# Patient Record
Sex: Male | Born: 1955 | Race: White | Hispanic: No | Marital: Single | Smoking: Current every day smoker
Health system: Southern US, Community
[De-identification: ages and names within clinical notes are randomized; demographics above are authoritative.]

## PROBLEM LIST (undated history)

## (undated) DIAGNOSIS — J310 Chronic rhinitis: Secondary | ICD-10-CM

## (undated) DIAGNOSIS — K219 Gastro-esophageal reflux disease without esophagitis: Secondary | ICD-10-CM

## (undated) DIAGNOSIS — R6882 Decreased libido: Secondary | ICD-10-CM

## (undated) DIAGNOSIS — F429 Obsessive-compulsive disorder, unspecified: Secondary | ICD-10-CM

## (undated) DIAGNOSIS — G4733 Obstructive sleep apnea (adult) (pediatric): Secondary | ICD-10-CM

## (undated) DIAGNOSIS — I10 Essential (primary) hypertension: Secondary | ICD-10-CM

## (undated) DIAGNOSIS — J302 Other seasonal allergic rhinitis: Secondary | ICD-10-CM

## (undated) DIAGNOSIS — I2510 Atherosclerotic heart disease of native coronary artery without angina pectoris: Secondary | ICD-10-CM

## (undated) DIAGNOSIS — Z85828 Personal history of other malignant neoplasm of skin: Secondary | ICD-10-CM

## (undated) HISTORY — DX: Personal history of other malignant neoplasm of skin: Z85.828

## (undated) HISTORY — DX: Other seasonal allergic rhinitis: J30.2

## (undated) HISTORY — DX: Gastro-esophageal reflux disease without esophagitis: K21.9

## (undated) HISTORY — DX: Essential (primary) hypertension: I10

## (undated) HISTORY — PX: COLONOSCOPY: SHX174

## (undated) HISTORY — PX: APPENDECTOMY: SHX54

## (undated) HISTORY — DX: Chronic rhinitis: J31.0

## (undated) HISTORY — DX: Decreased libido: R68.82

## (undated) HISTORY — PX: WISDOM TOOTH EXTRACTION: SHX21

## (undated) HISTORY — PX: TONSILLECTOMY: SUR1361

## (undated) HISTORY — DX: Obsessive-compulsive disorder, unspecified: F42.9

## (undated) HISTORY — PX: SKIN CANCER EXCISION: SHX779

## (undated) HISTORY — DX: Obstructive sleep apnea (adult) (pediatric): G47.33

---

## 1997-12-03 ENCOUNTER — Encounter: Payer: Self-pay | Admitting: Emergency Medicine

## 1997-12-03 ENCOUNTER — Observation Stay (HOSPITAL_COMMUNITY): Admission: EM | Admit: 1997-12-03 | Discharge: 1997-12-04 | Payer: Self-pay | Admitting: Emergency Medicine

## 1999-04-13 ENCOUNTER — Emergency Department (HOSPITAL_COMMUNITY): Admission: EM | Admit: 1999-04-13 | Discharge: 1999-04-13 | Payer: Self-pay | Admitting: Emergency Medicine

## 1999-07-20 ENCOUNTER — Ambulatory Visit (HOSPITAL_COMMUNITY): Admission: RE | Admit: 1999-07-20 | Discharge: 1999-07-20 | Payer: Self-pay | Admitting: Gastroenterology

## 2002-08-23 ENCOUNTER — Ambulatory Visit (HOSPITAL_BASED_OUTPATIENT_CLINIC_OR_DEPARTMENT_OTHER): Admission: RE | Admit: 2002-08-23 | Discharge: 2002-08-23 | Payer: Self-pay | Admitting: Family Medicine

## 2006-08-28 ENCOUNTER — Inpatient Hospital Stay (HOSPITAL_COMMUNITY): Admission: EM | Admit: 2006-08-28 | Discharge: 2006-08-31 | Payer: Self-pay | Admitting: Emergency Medicine

## 2006-09-29 ENCOUNTER — Encounter: Admission: RE | Admit: 2006-09-29 | Discharge: 2006-09-29 | Payer: Self-pay | Admitting: Neurosurgery

## 2008-06-03 IMAGING — CT CT CERVICAL SPINE W/O CM
2 of 13 series · 7 of 33 positions shown, 8 images · IV contrast (omnipaque)
Comparison: None.
COMPARISON: None.
COMPARISON: None.

CLINICAL DATA: 51 year-old-male fell 10 feet off a roof and landed on cement. 
 HEAD CT WITHOUT CONTRAST:
TECHNIQUE: Contiguous axial images were obtained from the base of the skull through the vertex according to standard protocol without contrast.
TECHNIQUE: Multidetector CT imaging of the cervical spine was performed.  Multiplanar CT image reconstructions were also generated.
TECHNIQUE: Multidetector CT imaging of the chest was performed following the standard protocol during bolus administration of intravenous contrast.
 Contrast:  100 cc Omnipaque 300.
TECHNIQUE: Multidetector CT imaging of the abdomen was performed following the standard protocol during bolus administration of intravenous contrast.
TECHNIQUE: Multidetector CT imaging of the pelvis was performed following the standard protocol during bolus administration of intravenous contrast.

[Series 12: abd pelvis · axial · 0.98mm/px · z∈[-70,+485]mm · 3 of 112 slices shown, 4 images]
[im 1/112  soft-tissue]
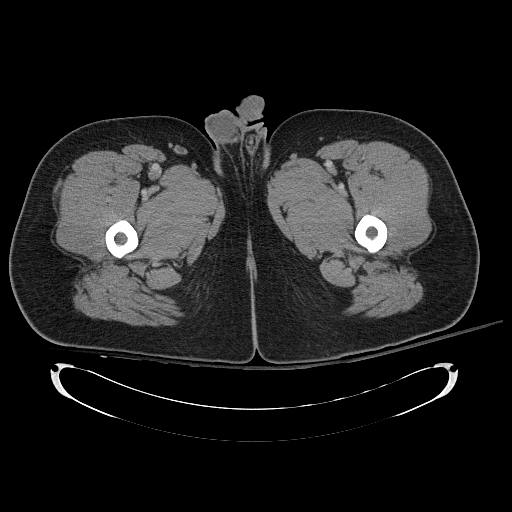
[im 1/112  bone]
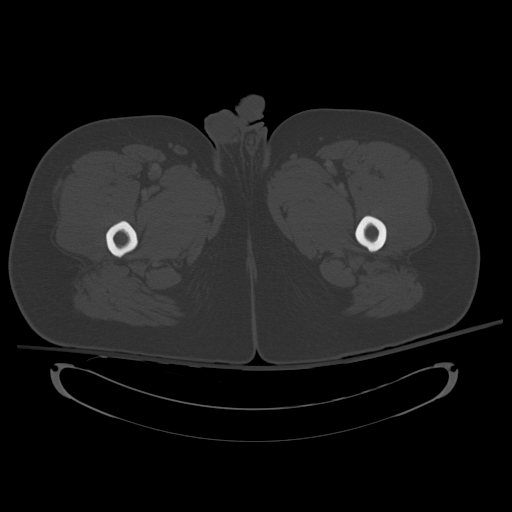
[im 56/112  bone]
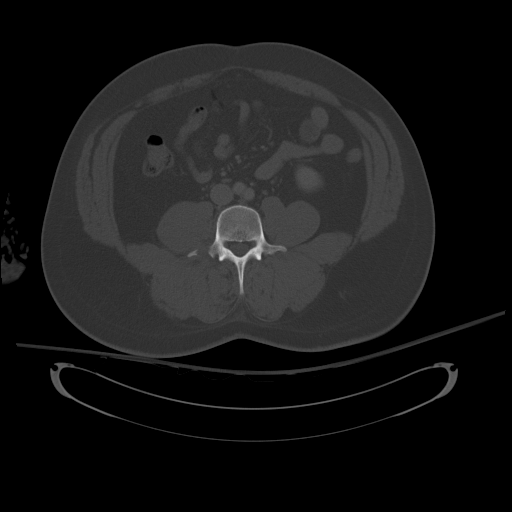
[im 112/112  bone]
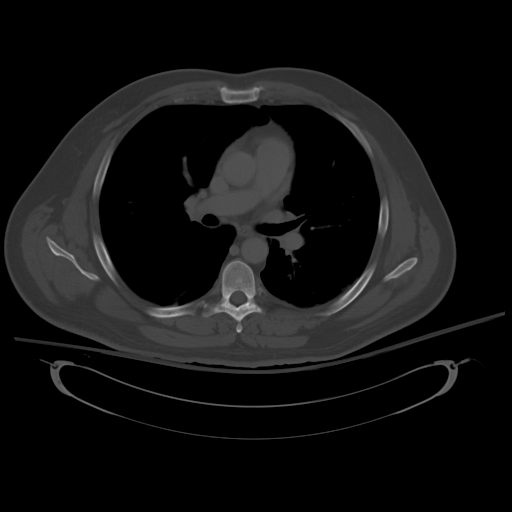

[Series 607: sag chest · sagittal · 0.85mm/px · 4 of 124 slices shown]
[im 25/124  bone]
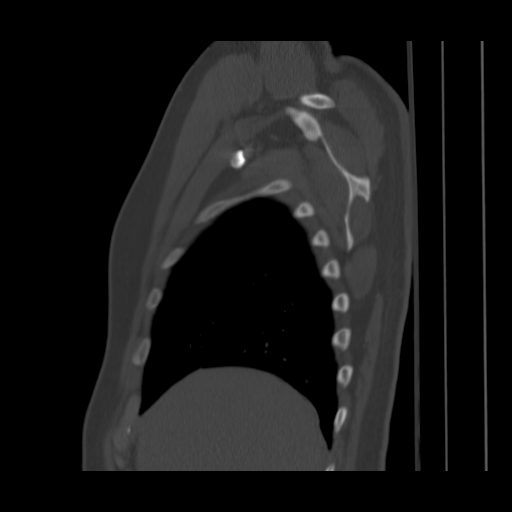
[im 50/124  bone]
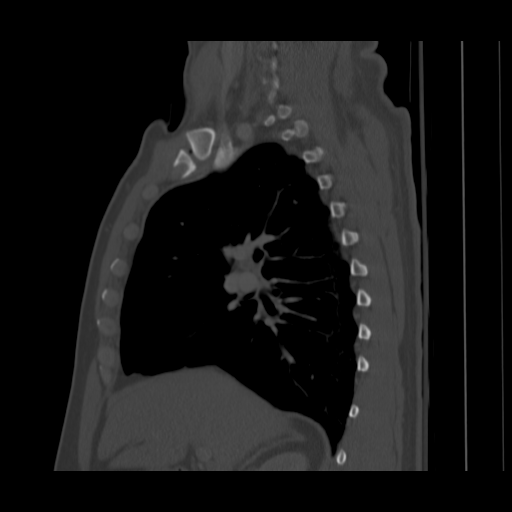
[im 74/124  bone]
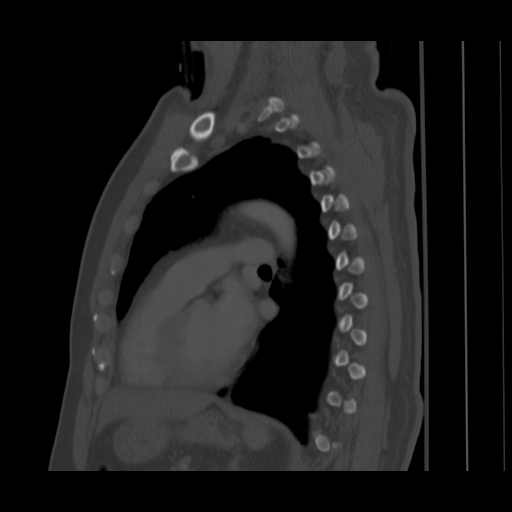
[im 99/124  bone]
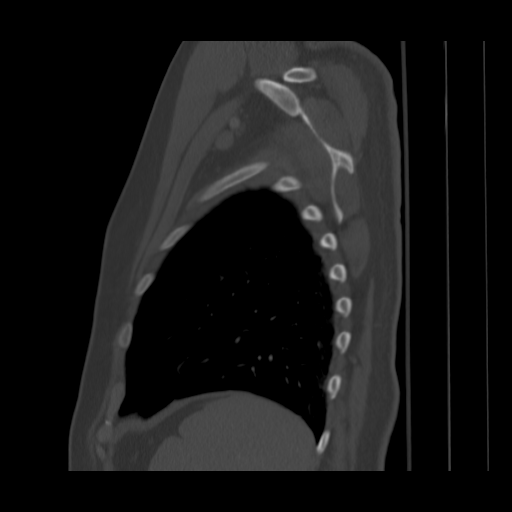

[7 of 33 positions shown; findings below may reference images not displayed]

FINDINGS: There is a small interhemispheric subdural hematoma anteriorly.  No other significant intracranial abnormality.  The ventricles are in the midline without mass effect or shift.     No extra-axial fluid collections are seen.  The brain stem and cerebellum are grossly normal.  
 The bony calvarium is intact. No fractures are seen.  The visualized paranasal sinuses and mastoid air cells are clear.
IMPRESSION: 1.  Small anterior interhemispheric subdural hematoma.  
 2.  No other acute intracranial findings and no skull fracture.  There is a small scalp hematoma posteriorly at the vertex noted. 
 CERVICAL SPINE CT WITHOUT CONTRAST:
FINDINGS: The sagittal images demonstrate normal alignment of the cervical vertebral bodies.  No fracture is seen.  No abnormal prevertebral soft tissue swelling.  The facets are normally aligned.  No facet or laminar fractures are seen.   The skull base, C1 and C1-2 articulations are maintained.  The dens is normal.  
 Incidental note is made of a shallow right paracentral and medial foraminal disc protrusion at C4-5.
IMPRESSION: 1.  Normal alignment.   No acute bony findings. 
 2.  Small right paracentral and medial foraminal disc protrusion at C4-5. 
 CHEST CT WITH CONTRAST:
FINDINGS: The chest wall, soft tissues and bony structures are unremarkable.  I don?t see any evidence for thoracic spine fracture or sternal fracture.  
 The heart is normal in size.  No pericardial effusion. No mediastinal hematoma.  The aorta is normal in caliber.  The major branch vessels are normal.  No dissection.  The esophagus is normal.   
 Examination of the lung parenchyma demonstrates no acute pulmonary findings.   No pneumothorax.   Small blebs are noted at the lung apices.   Minimal dependent atelectasis is noted. The tracheobronchial tree is unremarkable.  No pulmonary contusions.   No definite rib fractures.
IMPRESSION: 1.  Intact bony thorax. 
 2.  No acute pulmonary findings. 
 3.  Normal appearance of the heart and great vessels.   
 ABDOMEN CT WITH CONTRAST:
FINDINGS: Mild diffuse fatty infiltration of the liver.  No focal hepatic lesions or hepatic laceration.  The spleen is intact.  The pancreas, adrenal glands and kidneys demonstrate no significant findings.  Small bilateral renal cysts are noted.   The aorta is normal in caliber. No dissection.   The stomach, duodenum, small bowel and colon are unremarkable.  The study is limited without oral contrast.  No mesenteric or retroperitoneal hematomas.  
 The thoracolumbar vertebral bodies are intact with mild degenerative spurring changes.  No spinal canal compromise.  The patient does have transverse process fractures on the right at L1, L2, L3, L4, and L5.    The vertebral bodies are maintained.   No facet fractures.  No obvious hematoma in the right paraspinal musculature or psoas.
IMPRESSION: 1.  No acute abdominal findings. 
 2.  Transverse process fractures on the right, L1 through L5. 
 PELVIS CT WITH CONTRAST:
FINDINGS: The rectum, sigmoid colon and visualized small bowel loops are unremarkable.   The bladder is normal.   The appendix is visualized and is normal.   The bony pelvis is intact.
IMPRESSION: No acute pelvic findings.

## 2008-06-04 IMAGING — CT CT HEAD W/O CM
1 of 2 series · 16 of 30 positions shown, 20 images · IV contrast (agent unspecified)
Comparison: 08/28/06.

CLINICAL DATA: Follow-up subdural.
HEAD CT WITHOUT CONTRAST:
TECHNIQUE: Contiguous axial images were obtained from the base of the skull through the vertex according to standard protocol without contrast.

[Series 3: recon 2: brain · axial · 0.47mm/px · z∈[+161,+302]mm · 16 of 56 slices shown, 20 images]
[im 3/56  brain]
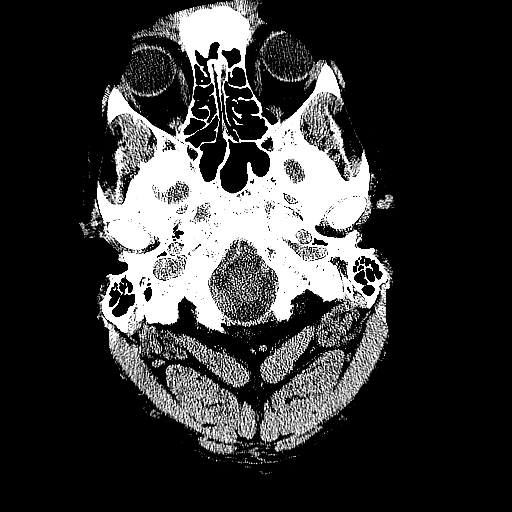
[im 3/56  bone]
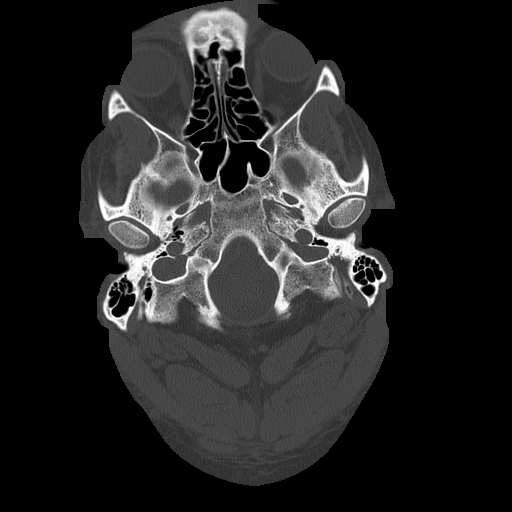
[im 6/56  brain]
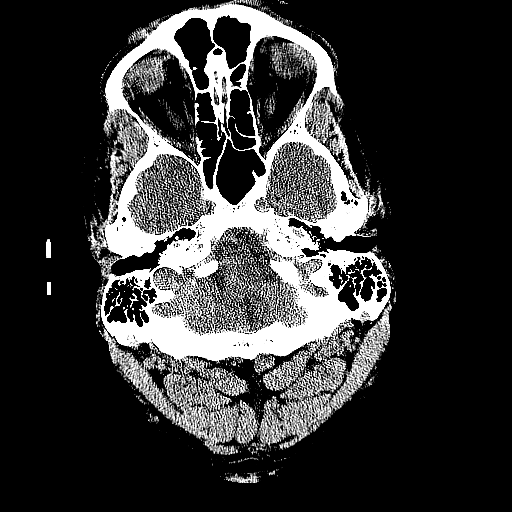
[im 9/56  brain]
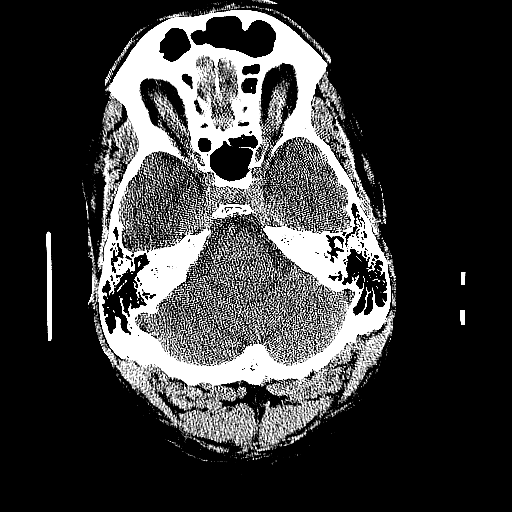
[im 12/56  brain]
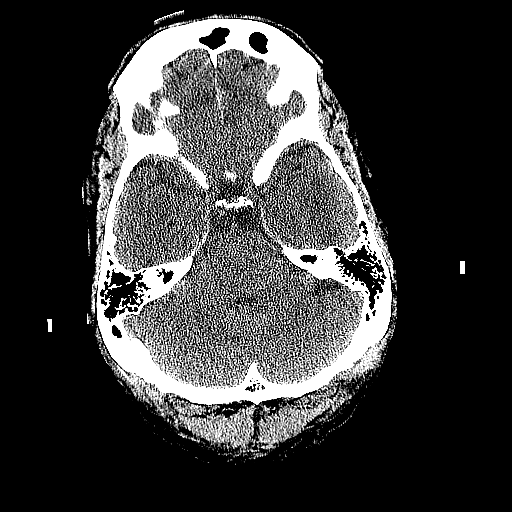
[im 18/56  brain]
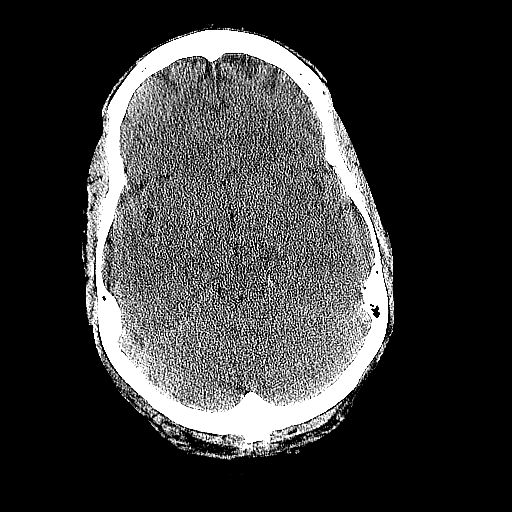
[im 18/56  bone]
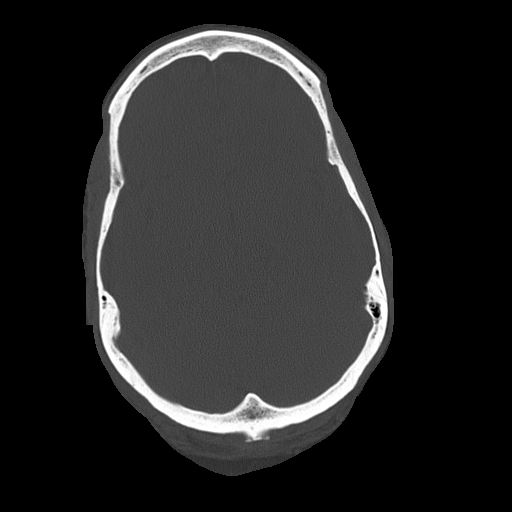
[im 21/56  brain]
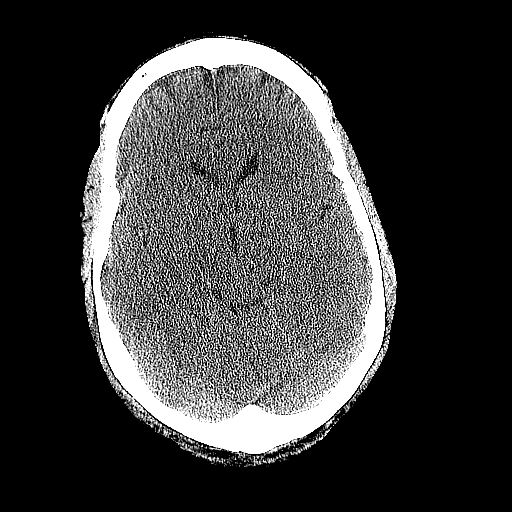
[im 24/56  brain]
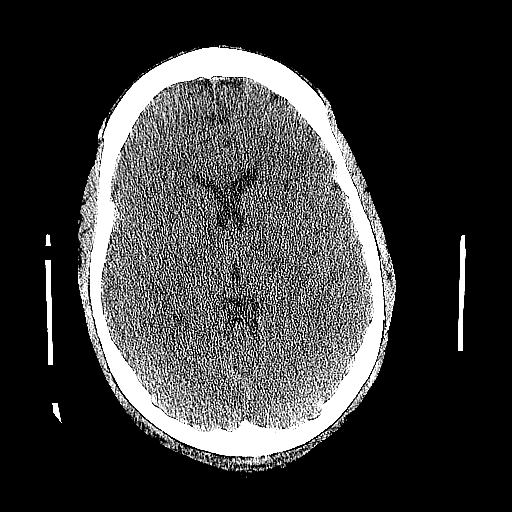
[im 27/56  brain]
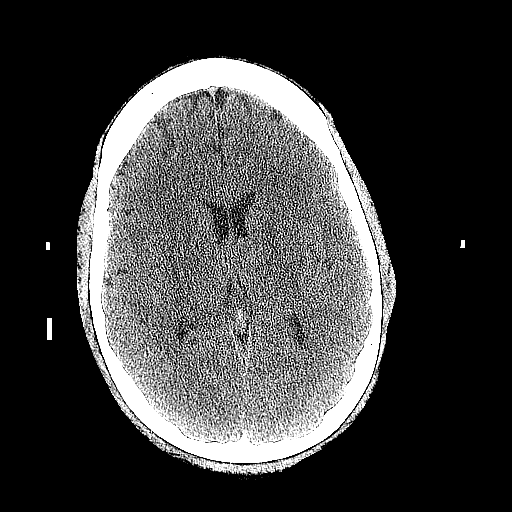
[im 29/56  brain]
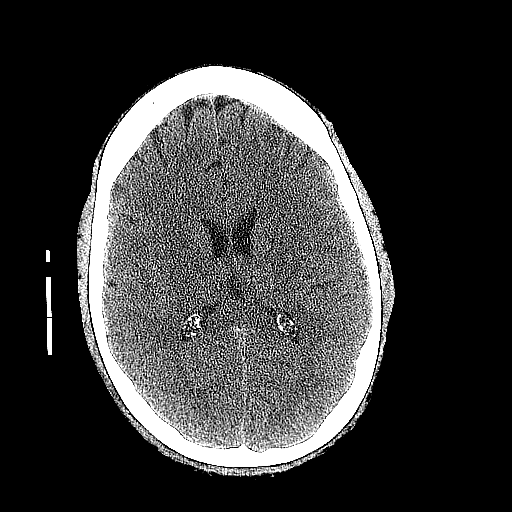
[im 29/56  bone]
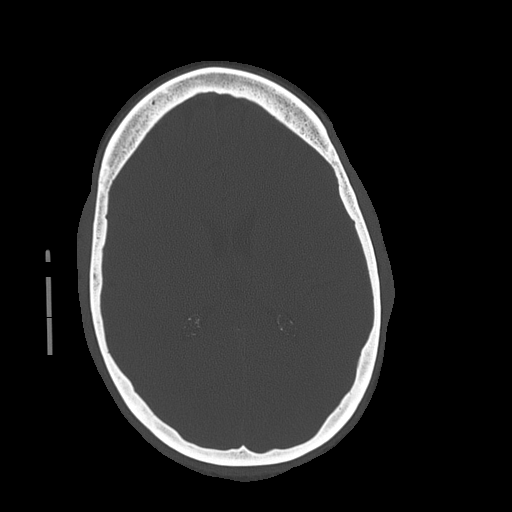
[im 32/56  brain]
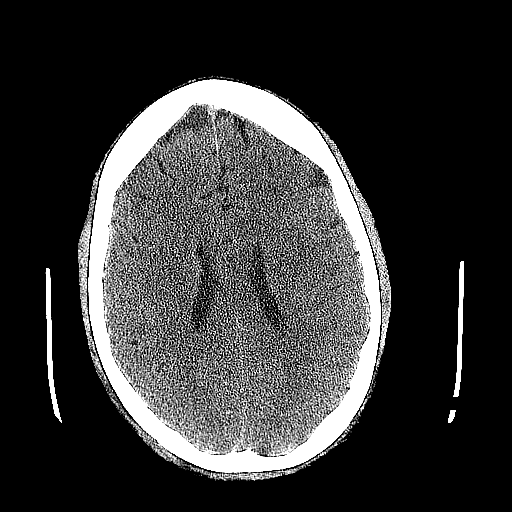
[im 35/56  brain]
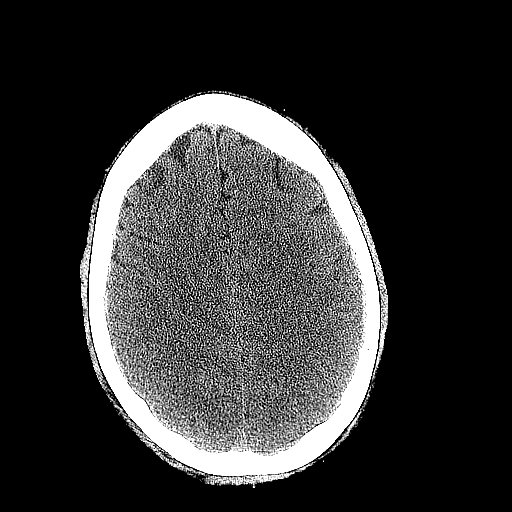
[im 38/56  brain]
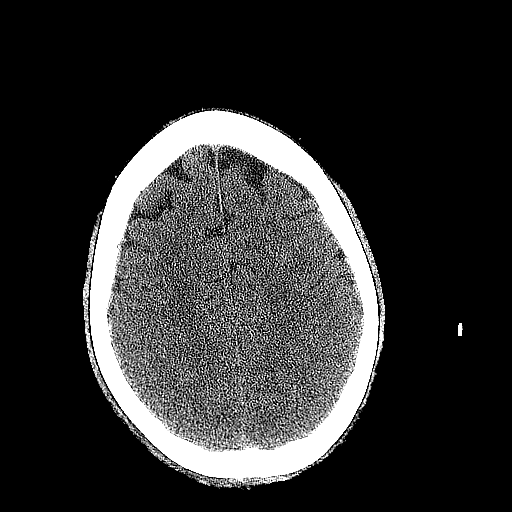
[im 44/56  brain]
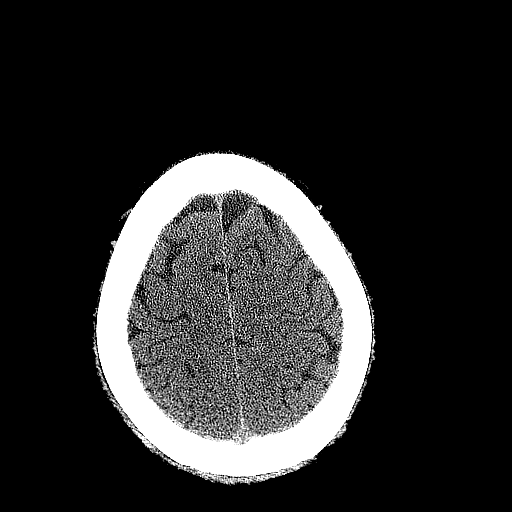
[im 44/56  bone]
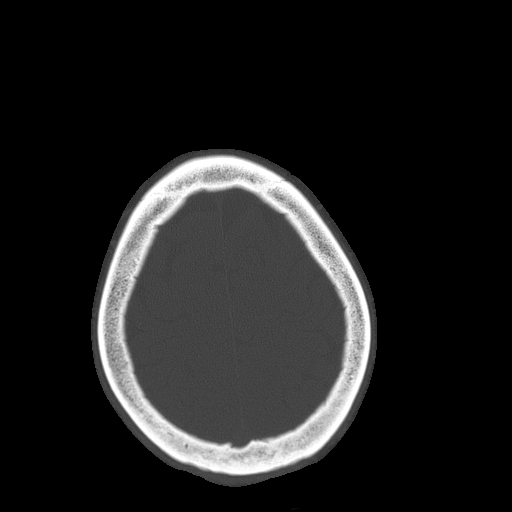
[im 47/56  brain]
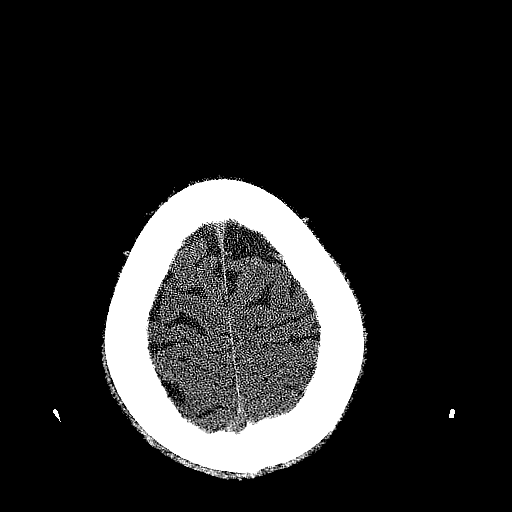
[im 50/56  brain]
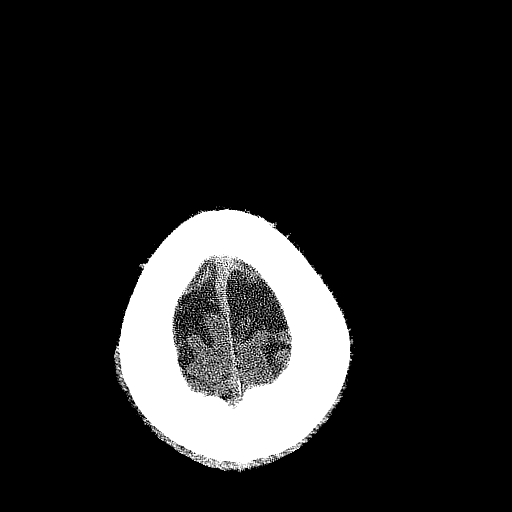
[im 53/56  brain]
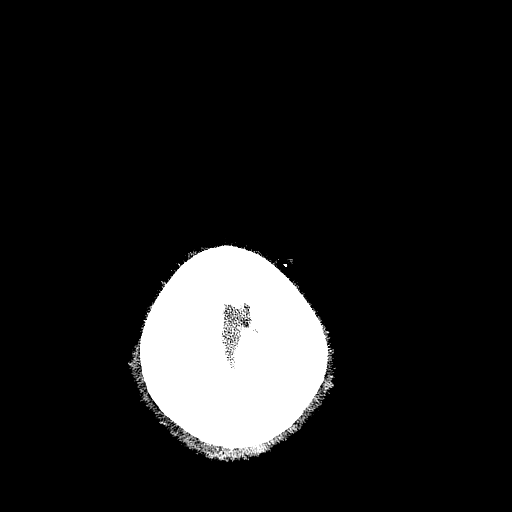

[16 of 30 positions shown; findings below may reference images not displayed]

FINDINGS: There is no additional bleeding. A small subdural along the falx  anteriorly is less notable. Brain parenchyma continues to have normal appearance. No skull fracture. Sinuses are clear.
IMPRESSION: Small anterior parafalcine subdural hematoma less noticeable. No sign of additional bleeding or any other complication or new finding.

## 2010-07-31 NOTE — H&P (Signed)
Gabriel Bean, Gabriel Bean               ACCOUNT NO.:  [id number]   MEDICAL RECORD NO.:  [id number]          PATIENT TYPE:  EMS   LOCATION:  MAJO                         FACILITY:  [facility]   PHYSICIAN:  Gabrielle Dare. Janee Morn, M.D.DATE OF BIRTH:  1955/09/04   DATE OF ADMISSION:  08/28/2006  DATE OF DISCHARGE:                              HISTORY & PHYSICAL   CHIEF COMPLAINT:  Back pain, headache and shoulder pain after a fall.   HISTORY OF PRESENT ILLNESS:  The patient is a 54 year old white male who  fell off a 1-story roof onto concrete earlier today; he was trying to  clean some debris off of his roof and she was going to get down on the  ladder, when it slipped out from underneath him.  He had a brief loss of  consciousness, but had regained by the time his wife came outside.  He  is complaining of back pain, headache and shoulder pain.  He was worked  up in the emergency department and found to have a small anterior  interhemispheric subdural hematoma as well as L1 through L5 transverse  process fractures on the right.  We are asked to see him for admission.   PAST MEDICAL HISTORY:  1. GERD.  2. OCD.   PAST SURGICAL HISTORY:  1. Appendectomy.  2. Tonsillectomy.   SOCIAL HISTORY:  He denies drug use.  He smokes.  He rarely drinks  alcohol.  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   ALLERGIES:  NO KNOWN DRUG ALLERGIES.   MEDICATIONS:  1. Zoloft 2 mg daily.  2. Protonix 40 mg daily.   REVIEW OF SYSTEMS:  Completed for 12 system and musculoskeletal exam is  positive for back pain, headache and shoulder pain as described above.  Remainder of the review of systems was not remarkable.   PHYSICAL EXAM:  VITAL SIGNS:  Temperature 97.0, pulse 87, respirations  18, blood pressure 146/79, O2 SATs 100%.  SKIN:  Warm and dry.  GENERAL:  He has a scar on his back beneath his right scapula from a  skin surgery.  HEENT:  Normocephalic.  No hematoma.  Eyes:  Pupils are equal and  reactive.  Sclerae are clear.  Ears are clear bilaterally with no  hemotympanum.  Face is atraumatic.  NECK:  No midline tenderness or step-offs.  He does have some muscular  tenderness bilaterally at the base of his neck.  PULMONARY:  Lungs are  clear to auscultation.  Good respiratory effort is present.  CARDIOVASCULAR:  Heart is regular.  No murmurs are heard.  Impulse is  palpable in the left chest.  Distal pulses are 2+ throughout.  Abdomen  is soft and nontender.  No organomegaly or masses are noted.  MUSCULOSKELETAL:  Pelvis is stable anteriorly.  He has some small  abrasions over his right fingers, but is otherwise unremarkable.  BACK:  Some tenderness over the right lumbar paraspinous region.  NEUROLOGIC:  Glasgow coma scale is 15.  Strength is equal and he is  moving all extremities well.   LABORATORY STUDIES:  Sodium 138, potassium  4.6, chloride 109, CO2 23,  BUN 15, glucose 162.  White blood cell count 13.4, hemoglobin 16.2.  AST  52, ALT 35, alkaline phosphatase 29, bilirubin 1.8.   CT scan of the head:  Anterior interhemispheric subdural hematoma.   CT scan of neck negative.   CT scan of chest negative.   CT scan of abdomen and pelvis:  Right L1 through L5 transverse process  fractures.   IMPRESSION:  Status post fall with:  1. Traumatic brain injury/subdural hematoma.  2. L1 through L5 transverse process fractures on the right.  3. Gastroesophageal reflux disease.  4. Obsessive-compulsive disorder.  5. Tobacco abuse.  6. Mild elevation of liver function tests.   PLAN:  To admit him to Trauma Service and we will obtain neurosurgery  consultation and we will check a followup head CT in the a.m.      Gabrielle Dare Janee Morn, M.D.  Electronically Signed     BET/MEDQ  D:  08/28/2006  T:  08/29/2006  Job:  [id number]   cc:   Cristi Loron, M.D.

## 2010-07-31 NOTE — Discharge Summary (Signed)
Gabriel Bean, Gabriel Bean               ACCOUNT NO.:  [id number]   MEDICAL RECORD NO.:  [id number]          PATIENT TYPE:  INP   LOCATION:  5123                         FACILITY:  [facility]   PHYSICIAN:  Gabrielle Dare. Janee Morn, M.D.DATE OF BIRTH:  1955-07-29   DATE OF ADMISSION:  08/28/2006  DATE OF DISCHARGE:  08/31/2006                               DISCHARGE SUMMARY   DISCHARGE DIAGNOSES:  1. Fall.  2. Traumatic brain injury with subdural hematoma.  3. L1-L5 left-sided transverse process fractures.  4. Depression.  5. Gastroesophageal reflux disease.  6. Obesity.  7. Tobacco use   CONSULTANTS:  Cristi Loron, M.D., neurosurgery.   PROCEDURES:  None.   HISTORY OF PRESENT ILLNESS:  This is a 55 year old white male who was on  a ladder at home and fell approximately 6 feet.  He came in as a non-  trauma code complaining of significant back pain, headache and shoulder  pain.  His workup demonstrated multiple transverse process fractures in  the L-spine and a questionable subdural hematoma.  He was admitted, and  neurosurgery was consulted.   HOSPITAL COURSE:  The patient's hospital course was complicated mainly  by back pain.  This took some time to get under control, but eventually  were able to with oral medications.  He had no significant sequelae from  his head injury.  We are able to discharge him home in good condition in  the care of his wife.   DISCHARGE MEDICATIONS:  1. Percocet 5/325, take one to two p.o. q.4 h p.r.n. pain, #80 with no      refill.  2. Robaxin 500 mg tablets, take three p.o. q.6 h p.r.n. spasm x4 days,      then one to two p.o. q.6 h p.r.n. spasm, #100 with no refill.   FOLLOWUP:  The patient will call the trauma service for questions or  concerns.      Earney Hamburg, P.A.      Gabrielle Dare Janee Morn, M.D.  Electronically Signed    MJ/MEDQ  D:  08/31/2006  T:  08/31/2006  Job:  [id number]   cc:   Cristi Loron, M.D.

## 2010-07-31 NOTE — Consult Note (Signed)
Gabriel Bean, Gabriel Bean NO.:  [id number]   MEDICAL RECORD NO.:  [id number]          PATIENT TYPE:  INP   LOCATION:  2113                         FACILITY:  [facility]   PHYSICIAN:  Cristi Loron, M.D.DATE OF BIRTH:  [birth year]   DATE OF CONSULTATION:  08/28/2006  DATE OF DISCHARGE:                                 CONSULTATION   CHIEF COMPLAINT:  Fall.   HISTORY OF PRESENT ILLNESS:  The patient is a 55 year old white male who  was on a ladder at home, when he fell approximately 6 feet.  He does not  recall any loss of consciousness but did feel woozy.  There was no  seizure, no nausea or vomiting, et Karie Soda.  The patient's wife called  EMS and he was transported to [facility], where he was  evaluated by the trauma service and emergency room staff.  The  evaluation included a cranial CT scan, which demonstrated a questionable  interhemispheric subdural hematoma, as well as abdominal and pelvic CT,  which demonstrated L1-L5 transverse process fractures.  Neurosurgical  consultation was requested by the trauma service.   Presently, the patient denies headaches, neck pain, numbness, tingling,  weakness, seizures, nausea, vomiting, abdominal pain, chest pain, et  Karie Soda.  He does admit to some right-sided low back pain.   PAST MEDICAL HISTORY:  Positive only for remote history of tonsillitis  and appendicitis, gastroesophageal reflux disease and depression.   PAST SURGICAL HISTORY:  Appendectomy, tonsillectomy.   MEDICATIONS PRIOR TO ADMISSION:  Zoloft, Protonix.   ALLERGIES:  No known drug allergies.   FAMILY MEDICAL HISTORY:  The patient's mother is age 77, and in good  health for her age.  The patient's father is age 37, and he has multiple  medical problems including hypertension, diabetes mellitus, colon  cancer, renal cell carcinoma.   SOCIAL HISTORY:  The patient is married, he has no children, and lives  in [place].  He is employed at the  post office.  He smokes 1-1/2  packs per day of cigarettes, and I advised him to quit.  He drinks  alcohol rarely, denies drug use.   REVIEW OF SYSTEMS:  Negative except as above.   PHYSICAL EXAMINATION:  GENERAL:  A pleasant 55 year old white male in no  apparent distress, watching TV.  HEENT:  Normocephalic, atraumatic.  His pupils are equal, round,  reactive to light.  Extraocular muscles are intact.  Oropharynx benign.  Tympanic membranes are clear bilaterally, without evidence of CSF,  otorrhea, hemotympanum.  There is no CSF rhinorrhea, raccoon's eyes,  Battle sign, et Product manager.  NECK:  Supple.  No masses or deformities, tracheal deviation, or jugular  distention.  He has a fairly normal cervical range of motion.  Spurling's test is negative.  Lhermitte's sign was not present.  CHEST:  Thorax is symmetric, lungs clear to auscultation.  HEART:  Regular rate and rhythm.  ABDOMEN:  Soft, nontender.  EXTREMITIES:  No obvious deformities.  BACK:  He is diffusely mildly tender to palpation throughout his lumbar  spine, without obvious deformity or tenderness.  NEUROLOGIC:  The  patient is alert and oriented x3.  Cranial nerves II-  XII are examined and bilaterally grossly normal.  Vision and hearing are  grossly normal bilaterally.  Motor strength is 5/5 deltoid, biceps,  triceps, hand grip, quadriceps, gastrocnemius, hallucis longus.  Sensory  function is intact to light touch and sensation in all tested dermatomes  bilaterally.  Cerebellar function is intact to rapid alternating  movements of the upper extremities bilaterally.  Deep tendon reflexes  are 1/4 in biceps, triceps, quadriceps, and gastrocnemius.  There is no  ankle clonus.   IMAGING STUDIES:  I have reviewed the patient's cranial CT head  performed without contrast at [facility] on August 28, 2006:  There is a questionable interhemispheric subdural hematoma versus  calcified falx. There is no mass effect, skull  fracture, et Karie Soda.   I also read the patient's cervical CT performed at [facility]  without contrast on August 28, 2006:  There is no fracture or subluxation,  et Karie Soda.  I also reviewed the patient's abdominal-pelvic CT only as it  pertains to his lumbar spine.  He has transverse process fractures on  the right from L1 to L5.   ASSESSMENT AND PLAN:  Closed head injury, questionable interhemispheric  subdural hematoma.  The patient is neurologically normal, and we simply  need to repeat his CAT scan in the morning, make sure that there has  been no change in this hyperdensity, although I think this could well  represent some calcium in his falx.   Right L1-L5 transverse process fractures.  I discussed this with him.  There is really nothing to do about this.  This will heal with time.   I will see the patient tomorrow, and if his CAT scan looks good, he can  be discharged from my point of view and follow up with me in the office  in a month or two.  I have answered all of the patient's questions.      Cristi Loron, M.D.  Electronically Signed     JDJ/MEDQ  D:  08/28/2006  T:  08/29/2006  Job:  [id number]

## 2011-01-03 LAB — HEPATIC FUNCTION PANEL
Albumin: 4.3
Bilirubin, Direct: 0.2
Bilirubin, Direct: 0.6 — ABNORMAL HIGH
Indirect Bilirubin: 0.2 — ABNORMAL LOW
Total Bilirubin: 1.8 — ABNORMAL HIGH

## 2011-01-03 LAB — DIFFERENTIAL
Basophils Absolute: 0.1
Basophils Relative: 1
Eosinophils Absolute: 0.2
Monocytes Relative: 5
Neutrophils Relative %: 72

## 2011-01-03 LAB — CBC
HCT: 41.7
MCHC: 33.6
MCHC: 34.1
MCV: 86.6
MCV: 87.9
Platelets: 222
RBC: 5.5
RDW: 13.7
RDW: 14

## 2011-01-03 LAB — I-STAT 8, (EC8 V) (CONVERTED LAB)
Acid-Base Excess: 4 — ABNORMAL HIGH
Operator id: 265961
Potassium: 4.6
TCO2: 23
pCO2, Ven: 22.2 — ABNORMAL LOW
pH, Ven: 7.619

## 2011-01-03 LAB — BASIC METABOLIC PANEL
BUN: 14
Chloride: 107
Glucose, Bld: 109 — ABNORMAL HIGH
Potassium: 4.7

## 2011-01-03 LAB — POCT I-STAT CREATININE
Creatinine, Ser: 1.2
Operator id: 265961

## 2011-06-28 ENCOUNTER — Ambulatory Visit (INDEPENDENT_AMBULATORY_CARE_PROVIDER_SITE_OTHER)
Admission: RE | Admit: 2011-06-28 | Discharge: 2011-06-28 | Disposition: A | Discharge status: Home / Self Care | Payer: Federal, State, Local not specified - PPO | Source: Ambulatory Visit | Attending: Internal Medicine | Admitting: Internal Medicine

## 2011-06-28 ENCOUNTER — Ambulatory Visit (INDEPENDENT_AMBULATORY_CARE_PROVIDER_SITE_OTHER): Payer: Federal, State, Local not specified - PPO | Admitting: Internal Medicine

## 2011-06-28 ENCOUNTER — Encounter: Payer: Self-pay | Admitting: Internal Medicine

## 2011-06-28 VITALS — BP 132/88 | HR 81 | Ht 73.0 in | Wt 258.0 lb

## 2011-06-28 DIAGNOSIS — J40 Bronchitis, not specified as acute or chronic: Secondary | ICD-10-CM

## 2011-06-28 DIAGNOSIS — R059 Cough, unspecified: Secondary | ICD-10-CM

## 2011-06-28 DIAGNOSIS — R054 Cough syncope: Secondary | ICD-10-CM

## 2011-06-28 DIAGNOSIS — R05 Cough: Secondary | ICD-10-CM

## 2011-06-28 DIAGNOSIS — J310 Chronic rhinitis: Secondary | ICD-10-CM

## 2011-06-28 DIAGNOSIS — Z720 Tobacco use: Secondary | ICD-10-CM

## 2011-06-28 DIAGNOSIS — F17200 Nicotine dependence, unspecified, uncomplicated: Secondary | ICD-10-CM

## 2011-06-28 NOTE — Patient Instructions (Signed)
Order- PFT    Dx bronchitis  Order- CXR   Dx bronchitis  Sample Dymista nasal spray  1-2 puffs each nostril once daily at bedtime  Sample [facility] maintenance inhaler   2 puffs, then rinse mouth, twice every day  Watch for reflux related to your coughing  I have to advise you to stop smoking. It is the root cause of your breathing problems.  The pattern of blacking out from coughing is called "tussive syncope"

## 2011-06-28 NOTE — Progress Notes (Signed)
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 PCP Dr Dewaine Oats He has smoked one pack per day for his adult life. Also admits to "too much" alcohol. 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 He has been recognizing episodic tussive syncope which occurs randomly. He has learned to anticipate and to prevent by sitting down. Bothersome postnasal drip with history of deviated septum. Denies seasonal rhinitis. Proton experiments GERD. He is aware of dyspnea on exertion. Coughs mostly when he is smoking. Denies history of pneumonia. Medical treatment for hypertension and allergic rhinitis, sleep apnea and OCD.  Prior to Admission medications   Medication Sig Start Date End Date Taking? Authorizing Provider  buPROPion (WELLBUTRIN XL) 300 MG 24 hr tablet Take 1 tablet by mouth daily.   Yes Historical Provider, MD  losartan-hydrochlorothiazide (HYZAAR) 100-12.5 MG per tablet Take 1 tablet by mouth daily.   Yes Historical Provider, MD  NASONEX 50 MCG/ACT nasal spray Place 2 sprays into the nose daily.   Yes Historical Provider, MD  pantoprazole (PROTONIX) 40 MG tablet Take 1 tablet by mouth daily.   Yes Historical Provider, MD  sertraline (ZOLOFT) 100 MG tablet Take 2 tablets by mouth daily.   Yes Historical Provider, MD   Past Medical History  Diagnosis Date  . Hypertension   . Seasonal allergies   . OSA (obstructive sleep apnea)   . History of skin cancer    Past Surgical History  Procedure Date  . Appendectomy   . Skin cancer excision   . Tonsillectomy    Family History  Problem Relation Age of Onset  . Other Mother     allergies  . Heart disease Father   . Heart disease Brother   . Colon cancer Father   . Skin cancer Father   . Cancer Father     kidney   History   Social History  . Marital Status: Married    Spouse Name: N/A    Number of Children: N/A  . Years of Education: N/A    Occupational History  . Budget Analyst [employer]   Social History Main Topics  . Smoking status: Current Everyday Smoker -- 1.0 packs/day    Types: Cigarettes  . Smokeless tobacco: Current User    Types: Snuff   Comment: started smoking at age 83.    Marland Kitchen Alcohol Use: Yes     5 drinks per day  . Drug Use: No  . Sexually Active: Not on file   Other Topics Concern  . Not on file   Social History Narrative  . No narrative on file   ROS-see HPI Constitutional:   No-   weight loss, night sweats, fevers, chills, fatigue, lassitude. HEENT:   No-  headaches, difficulty swallowing, tooth/dental problems, sore throat,       No-  sneezing, itching, ear ache, +nasal congestion, post nasal drip,  CV:  No-   chest pain, orthopnea, PND, swelling in lower extremities, anasarca, dizziness, palpitations Resp: + shortness of breath with exertion or at rest.              No-   productive cough,  + non-productive cough,  No- coughing up of blood.              No-   change in color of mucus.  No- wheezing.   Skin: No-   rash or lesions. GI:  No-  heartburn, indigestion, abdominal pain, nausea, vomiting, diarrhea,                 change in bowel habits, loss of appetite GU:  MS:  No-   joint pain or swelling.   Neuro-     nothing unusual Psych:  No- change in mood or affect. No depression or anxiety.  No memory loss.  OBJ- Physical Exam General- Alert, Oriented, Affect-appropriate, Distress- none acute, solid/ large man Skin- rash-none, lesions- none, excoriation- none Lymphadenopathy- none Head- atraumatic            Eyes- Gross vision intact, PERRLA, conjunctivae and secretions clear            Ears- Hearing, canals-normal            Nose- Clear, no-Septal dev, mucus, polyps, erosion, perforation             Throat- Mallampati II , mucosa - minor glandularity , drainage- none, tonsils- atrophic Neck- flexible , trachea midline, no stridor , thyroid nl, carotid no bruit Chest -  symmetrical excursion , unlabored           Heart/CV- RRR , no murmur , no gallop  , no rub, nl s1 s2                           - JVD- none , edema- none, stasis changes- none, varices- none           Lung- clear to P&A, wheeze- none, cough- none , dullness-none, rub- none           Chest wall-  Abd- tender-no, distended-no, bowel sounds-present, HSM- no Br/ Gen/ Rectal- Not done, not indicated Extrem- cyanosis- none, clubbing, none, atrophy- none, strength- nl Neuro- grossly intact to observation

## 2011-07-03 ENCOUNTER — Encounter: Payer: Self-pay | Admitting: Internal Medicine

## 2011-07-03 DIAGNOSIS — Z720 Tobacco use: Secondary | ICD-10-CM | POA: Insufficient documentation

## 2011-07-03 DIAGNOSIS — R05 Cough: Secondary | ICD-10-CM | POA: Insufficient documentation

## 2011-07-03 DIAGNOSIS — R054 Cough syncope: Secondary | ICD-10-CM | POA: Insufficient documentation

## 2011-07-03 DIAGNOSIS — J310 Chronic rhinitis: Secondary | ICD-10-CM | POA: Insufficient documentation

## 2011-07-03 NOTE — Assessment & Plan Note (Signed)
Typical scenario of very strong man who coughs hard and is taking antihypertensive treatment. The question is how much underlying lung disease he has. We discussed GERD and postnasal drip as cough triggers. Plan-repeat chest x-ray in October. Sample [facility] and Dymista. Smoking cessation was emphasized.

## 2011-07-03 NOTE — Assessment & Plan Note (Signed)
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 I talked through a framework for smoking cessation and tried to motivate him.

## 2011-07-03 NOTE — Assessment & Plan Note (Signed)
Chronic rhinitis, allergic and nonallergic. Irritant effect of smoking is important. Plan-smoking cessation. Sample  Dymista spray.

## 2011-07-04 NOTE — Progress Notes (Signed)
Quick Note:  LMTCB ______ 

## 2011-07-04 NOTE — Progress Notes (Signed)
Quick Note:  Pt aware of results. ______ 

## 2011-07-17 ENCOUNTER — Ambulatory Visit (INDEPENDENT_AMBULATORY_CARE_PROVIDER_SITE_OTHER): Payer: Federal, State, Local not specified - PPO | Admitting: Internal Medicine

## 2011-07-17 DIAGNOSIS — J40 Bronchitis, not specified as acute or chronic: Secondary | ICD-10-CM

## 2011-07-17 LAB — PULMONARY FUNCTION TEST

## 2011-07-17 NOTE — Progress Notes (Signed)
PFT done today. 

## 2011-08-13 ENCOUNTER — Encounter: Payer: Self-pay | Admitting: Internal Medicine

## 2011-08-13 ENCOUNTER — Ambulatory Visit (INDEPENDENT_AMBULATORY_CARE_PROVIDER_SITE_OTHER): Payer: Federal, State, Local not specified - PPO | Admitting: Internal Medicine

## 2011-08-13 VITALS — BP 128/72 | HR 86 | Ht 73.0 in | Wt 264.8 lb

## 2011-08-13 DIAGNOSIS — R05 Cough: Secondary | ICD-10-CM

## 2011-08-13 DIAGNOSIS — R059 Cough, unspecified: Secondary | ICD-10-CM

## 2011-08-13 DIAGNOSIS — Z720 Tobacco use: Secondary | ICD-10-CM

## 2011-08-13 DIAGNOSIS — J42 Unspecified chronic bronchitis: Secondary | ICD-10-CM

## 2011-08-13 DIAGNOSIS — R054 Cough syncope: Secondary | ICD-10-CM

## 2011-08-13 DIAGNOSIS — F17200 Nicotine dependence, unspecified, uncomplicated: Secondary | ICD-10-CM

## 2011-08-13 DIAGNOSIS — J310 Chronic rhinitis: Secondary | ICD-10-CM

## 2011-08-13 MED ORDER — MONTELUKAST SODIUM 10 MG PO TABS: 10.0000 mg | ORAL_TABLET | Freq: Every day | ORAL | Status: DC

## 2011-08-13 NOTE — Progress Notes (Signed)
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 PCP Dr Dewaine Oats He has smoked one pack per day for his adult life. Also admits to "too much" alcohol. 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 He has been recognizing episodic tussive syncope which occurs randomly. He has learned to anticipate and to prevent by sitting down. Bothersome postnasal drip with history of deviated septum. Denies seasonal rhinitis. Proton experiments GERD. He is aware of dyspnea on exertion. Coughs mostly when he is smoking. Denies history of pneumonia. Medical treatment for hypertension and allergic rhinitis, sleep apnea and OCD.  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 PCP Dr Dewaine Oats Still coughing-slightly better and has not passed out since last here. He has made no effort to stop smoking despite our counseling. Stays hoarse. Dymista overdrying him. PFT 07/17/2011-normal spirometry with insignificant response to bronchodilator. FEV1/FVC 0.74. High lung volumes may reflect some degree of air-trapping and hyperventilation but I can't rule out possibility that it is normal for him. Diffusion is normal.  ROS-see HPI Constitutional:   No-   weight loss, night sweats, fevers, chills, fatigue, lassitude. HEENT:   No-  headaches, difficulty swallowing, tooth/dental problems, sore throat,       No-  sneezing, itching, ear ache, +nasal congestion, post nasal drip,  CV:  No-   chest pain, orthopnea, PND, swelling in lower extremities, anasarca, dizziness, palpitations Resp: + shortness of breath with exertion or at rest.              No-   productive cough,  + non-productive cough,  No- coughing up of blood.              No-   change in color of mucus.  No- wheezing.   Skin: No-   rash or lesions. GI:  No-   heartburn, indigestion, abdominal pain,  nausea, vomiting,  GU:  MS:  No-   joint pain or swelling.   Neuro-     nothing unusual Psych:  No- change in mood or affect. No depression or anxiety.  No memory loss.  OBJ- Physical Exam General- Alert, Oriented, Affect-appropriate, Distress- none acute, solid/ large man Skin- rash-none, lesions- none, excoriation- none Lymphadenopathy- none Head- atraumatic            Eyes- Gross vision intact, PERRLA, conjunctivae and secretions clear            Ears- Hearing, canals-normal            Nose- watery sniffing, no-Septal dev,  polyps, erosion, perforation             Throat- Mallampati II , mucosa - minor glandularity , drainage- none, tonsils- atrophic Neck- flexible , trachea midline, no stridor , thyroid nl, carotid no bruit Chest - symmetrical excursion , unlabored           Heart/CV- RRR , no murmur , no gallop  , no rub, nl s1 s2                           - JVD- none , edema- none, stasis changes- none, varices- none           Lung- clear to P&A, wheeze- none, cough- none , dullness-none, rub- none  Chest wall-  Abd-  Br/ Gen/ Rectal- Not done, not indicated Extrem- cyanosis- none, clubbing, none, atrophy- none, strength- nl Neuro- grossly intact to observation

## 2011-08-13 NOTE — Patient Instructions (Addendum)
Ok to go back to using Nasonex nasal steroid spray rather than Dymista, which over dried.  Script sent for Singulair/ montelukast airway anti-inflamatory. Try this for a month. We can extend it if helpfull for nose and chest  Order- CXR to be done at October office visit   Dx chronic bronchitis  Please call as needed  An otc cough syrup like Delsym or Robitusin DM may help at times.

## 2011-08-17 DIAGNOSIS — J42 Unspecified chronic bronchitis: Secondary | ICD-10-CM | POA: Insufficient documentation

## 2011-08-17 NOTE — Assessment & Plan Note (Signed)
Improved. Plan-emphasis on smoking cessation and control of postnasal drip. Try Singulair to help nose and chest.

## 2011-08-17 NOTE — Assessment & Plan Note (Signed)
Allergic versus vasomotor rhinitis.  Plan-smoking cessation, Singulair. Changed nasal steroid back to Nasonex.

## 2011-08-17 NOTE — Assessment & Plan Note (Signed)
Plan-smoking cessation was discussed and reemphasized.

## 2011-08-17 NOTE — Assessment & Plan Note (Signed)
Plan-emphasis on smoking cessation 

## 2012-01-13 ENCOUNTER — Ambulatory Visit: Payer: Federal, State, Local not specified - PPO | Admitting: Internal Medicine

## 2015-07-27 DIAGNOSIS — E119 Type 2 diabetes mellitus without complications: Secondary | ICD-10-CM | POA: Diagnosis not present

## 2015-07-27 DIAGNOSIS — I10 Essential (primary) hypertension: Secondary | ICD-10-CM | POA: Diagnosis not present

## 2015-11-09 DIAGNOSIS — F17210 Nicotine dependence, cigarettes, uncomplicated: Secondary | ICD-10-CM | POA: Diagnosis not present

## 2015-11-09 DIAGNOSIS — Z7984 Long term (current) use of oral hypoglycemic drugs: Secondary | ICD-10-CM | POA: Diagnosis not present

## 2015-11-09 DIAGNOSIS — I10 Essential (primary) hypertension: Secondary | ICD-10-CM | POA: Diagnosis not present

## 2015-11-09 DIAGNOSIS — E7800 Pure hypercholesterolemia, unspecified: Secondary | ICD-10-CM | POA: Diagnosis not present

## 2015-11-09 DIAGNOSIS — E119 Type 2 diabetes mellitus without complications: Secondary | ICD-10-CM | POA: Diagnosis not present

## 2015-11-09 DIAGNOSIS — R55 Syncope and collapse: Secondary | ICD-10-CM | POA: Diagnosis not present

## 2015-11-09 DIAGNOSIS — F429 Obsessive-compulsive disorder, unspecified: Secondary | ICD-10-CM | POA: Diagnosis not present

## 2015-11-09 DIAGNOSIS — R05 Cough: Secondary | ICD-10-CM | POA: Diagnosis not present

## 2015-11-29 ENCOUNTER — Encounter: Payer: Self-pay | Admitting: Cardiology

## 2015-12-01 ENCOUNTER — Encounter: Payer: Self-pay | Admitting: Cardiology

## 2015-12-01 ENCOUNTER — Ambulatory Visit (INDEPENDENT_AMBULATORY_CARE_PROVIDER_SITE_OTHER): Payer: Federal, State, Local not specified - PPO | Admitting: Cardiology

## 2015-12-01 VITALS — BP 134/82 | HR 91 | Ht 72.0 in | Wt 258.0 lb

## 2015-12-01 DIAGNOSIS — R05 Cough: Secondary | ICD-10-CM | POA: Diagnosis not present

## 2015-12-01 DIAGNOSIS — R054 Cough syncope: Secondary | ICD-10-CM

## 2015-12-01 NOTE — Progress Notes (Signed)
Cardiology Office Note   Date:  12/01/2015   ID:  Gabriel Bean, DOB [birth year], MRN [id number]  PCP:  Lenora Boys, MD  Cardiologist:   Regan Lemming, MD    Chief Complaint  Patient presents with  . Advice Only    syncope w/cough     History of Present Illness: Gabriel Bean is a 60 y.o. male who presents today for cardiology evaluation.   He has been having issues of syncope which is exacerbated by coughing or laughing. He is a smoker and is not ready to quit. He says that he does not pass out at any other time. He says that when he has a coughing fit, he has been told to sit down. He says that every time it happens, he can predict when it Makaia Rappa cause him to pass out. He also gets dizzy when he stands up. This is the only time that he gets dizzy.   Today, he denies symptoms of palpitations, chest pain, shortness of breath, orthopnea, PND, lower extremity edema, claudication, presyncope, bleeding, or neurologic sequela. The patient is tolerating medications without difficulties and is otherwise without complaint today.    Past Medical History:  Diagnosis Date  . Chronic rhinitis   . GERD (gastroesophageal reflux disease)   . History of skin cancer   . Hypertension   . Low libido   . OCD (obsessive compulsive disorder)   . OSA (obstructive sleep apnea)   . Seasonal allergies    Past Surgical History:  Procedure Laterality Date  . APPENDECTOMY    . COLONOSCOPY     x3  . SKIN CANCER EXCISION    . TONSILLECTOMY    . WISDOM TOOTH EXTRACTION       Current Outpatient Prescriptions  Medication Sig Dispense Refill  . allopurinol (ZYLOPRIM) 300 MG tablet Take 300 mg by mouth daily.    Marland Kitchen amLODipine-olmesartan (AZOR) 5-40 MG tablet Take 1 tablet by mouth daily.    Marland Kitchen atorvastatin (LIPITOR) 20 MG tablet Take 20 mg by mouth daily.  5  . buPROPion (WELLBUTRIN XL) 300 MG 24 hr tablet Take 1 tablet by mouth daily.    . DULoxetine (CYMBALTA) 60 MG capsule Take 120 mg by  mouth daily.    . metFORMIN (GLUCOPHAGE) 1000 MG tablet Take 1,000 mg by mouth 2 (two) times daily.  5  . metoprolol succinate (TOPROL-XL) 25 MG 24 hr tablet Take 25 mg by mouth daily.    . naproxen sodium (ANAPROX) 220 MG tablet Take 220 mg by mouth daily as needed.    Marland Kitchen NASONEX 50 MCG/ACT nasal spray Place 2 sprays into the nose daily.    Marland Kitchen OVER THE COUNTER MEDICATION Take 20 mg by mouth daily. [facility] brand acid reducer    . sildenafil (REVATIO) 20 MG tablet Take 20 mg by mouth daily as needed.     No current facility-administered medications for this visit.     Allergies:   Review of patient's allergies indicates no known allergies.   Social History:  The patient  reports that he has been smoking Cigarettes.  He has been smoking about 1.00 pack per day. His smokeless tobacco use includes Snuff. He reports that he drinks alcohol. He reports that he does not use drugs.   Family History:  The patient's family history includes Cancer in his father; Colon cancer in his father; Heart disease in his brother and father; Other in his mother; Skin cancer in his father.  ROS:  Please see the history of present illness.   Otherwise, review of systems is positive for Sweats, fatigue, leg swelling, cough, dyspnea on exertion, snoring, joint swelling, balance problems, muscle pain, back pain, dizziness.   All other systems are reviewed and negative.    PHYSICAL EXAM: VS:  BP 134/82   Pulse 91   Ht 6' (1.829 m)   Wt 258 lb (117 kg)   BMI 34.99 kg/m  , BMI Body mass index is 34.99 kg/m. GEN: Well nourished, well developed, in no acute distress  HEENT: normal  Neck: no JVD, carotid bruits, or masses Cardiac: RRR; no murmurs, rubs, or gallops,no edema  Respiratory:  clear to auscultation bilaterally, normal work of breathing GI: soft, nontender, nondistended, + BS MS: no deformity or atrophy  Skin: warm and dry Neuro:  Strength and sensation are intact Psych: euthymic mood, full  affect  EKG:  EKG is ordered today. Personal review of the ekg ordered shows  sinus rhythm, rate 91  Recent Labs: No results found for requested labs within last 8760 hours.    Lipid Panel  No results found for: CHOL, TRIG, HDL, CHOLHDL, VLDL, LDLCALC, LDLDIRECT   Wt Readings from Last 3 Encounters:  12/01/15 258 lb (117 kg)  08/13/11 264 lb 12.8 oz (120.1 kg)  06/28/11 258 lb (117 kg)      Other studies Reviewed: Additional studies/ records that were reviewed today include: PCP notes  ASSESSMENT AND PLAN:  1.  Dizziness on standing: Speaking with him, he gets dizzy only when standing. It sounds that he is having orthostatic type symptoms. He does not have any worrisome signs or symptoms of cardiac disease. I have told him to be cognizant of the fact that he may get dizzy when he stands up too fast and is to steady himself.   2. Posttussive syncope: Trevione Wert refer to pulmonary for possible antitussive agent, as it appears that his syncope is related to coughing.     Current medicines are reviewed at length with the patient today.   The patient does not have concerns regarding his medicines.  The following changes were made today:  none  Labs/ tests ordered today include:  No orders of the defined types were placed in this encounter.    Disposition:   FU with Damarious Holtsclaw PRN  Signed, Solomon Skowronek Jorja LoaMartin Lachlan Pelto, MD  12/01/2015 3:09 PM     [facility] [address] [place] [address] [phone] (office) [phone] (fax)

## 2015-12-01 NOTE — Patient Instructions (Addendum)
Medication Instructions:    Your physician recommends that you continue on your current medications as directed. Please refer to the Current Medication list given to you today.  Labwork:  None ordered  Testing/Procedures:  None ordered  Follow-Up:  You have been referred to pulmonology   No follow up is needed at this time with Dr. Elberta Fortisamnitz.  He will see you on an as needed basis.   Thank you for choosing [facility]!!   Dory HornSherri Hector Venne, RN [phone]

## 2015-12-20 ENCOUNTER — Institutional Professional Consult (permissible substitution): Payer: Federal, State, Local not specified - PPO | Admitting: Pulmonary Disease

## 2015-12-21 DIAGNOSIS — E7800 Pure hypercholesterolemia, unspecified: Secondary | ICD-10-CM | POA: Diagnosis not present

## 2015-12-21 DIAGNOSIS — Z23 Encounter for immunization: Secondary | ICD-10-CM | POA: Diagnosis not present

## 2015-12-21 DIAGNOSIS — E790 Hyperuricemia without signs of inflammatory arthritis and tophaceous disease: Secondary | ICD-10-CM | POA: Diagnosis not present

## 2015-12-21 DIAGNOSIS — R911 Solitary pulmonary nodule: Secondary | ICD-10-CM | POA: Diagnosis not present

## 2015-12-21 DIAGNOSIS — E1165 Type 2 diabetes mellitus with hyperglycemia: Secondary | ICD-10-CM | POA: Diagnosis not present

## 2016-03-22 DIAGNOSIS — E7800 Pure hypercholesterolemia, unspecified: Secondary | ICD-10-CM | POA: Diagnosis not present

## 2016-03-22 DIAGNOSIS — F17210 Nicotine dependence, cigarettes, uncomplicated: Secondary | ICD-10-CM | POA: Diagnosis not present

## 2016-03-22 DIAGNOSIS — R911 Solitary pulmonary nodule: Secondary | ICD-10-CM | POA: Diagnosis not present

## 2016-03-22 DIAGNOSIS — E790 Hyperuricemia without signs of inflammatory arthritis and tophaceous disease: Secondary | ICD-10-CM | POA: Diagnosis not present

## 2016-03-22 DIAGNOSIS — E1165 Type 2 diabetes mellitus with hyperglycemia: Secondary | ICD-10-CM | POA: Diagnosis not present

## 2016-04-23 DIAGNOSIS — R911 Solitary pulmonary nodule: Secondary | ICD-10-CM | POA: Diagnosis not present

## 2016-04-23 DIAGNOSIS — F17210 Nicotine dependence, cigarettes, uncomplicated: Secondary | ICD-10-CM | POA: Diagnosis not present

## 2016-04-23 DIAGNOSIS — E1165 Type 2 diabetes mellitus with hyperglycemia: Secondary | ICD-10-CM | POA: Diagnosis not present

## 2016-04-23 DIAGNOSIS — E7800 Pure hypercholesterolemia, unspecified: Secondary | ICD-10-CM | POA: Diagnosis not present

## 2016-07-22 DIAGNOSIS — E790 Hyperuricemia without signs of inflammatory arthritis and tophaceous disease: Secondary | ICD-10-CM | POA: Diagnosis not present

## 2016-07-22 DIAGNOSIS — E1165 Type 2 diabetes mellitus with hyperglycemia: Secondary | ICD-10-CM | POA: Diagnosis not present

## 2016-07-22 DIAGNOSIS — I10 Essential (primary) hypertension: Secondary | ICD-10-CM | POA: Diagnosis not present

## 2016-07-22 DIAGNOSIS — E7800 Pure hypercholesterolemia, unspecified: Secondary | ICD-10-CM | POA: Diagnosis not present

## 2016-07-22 DIAGNOSIS — Z794 Long term (current) use of insulin: Secondary | ICD-10-CM | POA: Diagnosis not present

## 2016-07-22 DIAGNOSIS — R55 Syncope and collapse: Secondary | ICD-10-CM | POA: Diagnosis not present

## 2016-07-22 DIAGNOSIS — R269 Unspecified abnormalities of gait and mobility: Secondary | ICD-10-CM | POA: Diagnosis not present

## 2016-07-22 DIAGNOSIS — F419 Anxiety disorder, unspecified: Secondary | ICD-10-CM | POA: Diagnosis not present

## 2016-07-24 DIAGNOSIS — E119 Type 2 diabetes mellitus without complications: Secondary | ICD-10-CM | POA: Diagnosis not present

## 2016-07-25 ENCOUNTER — Other Ambulatory Visit: Payer: Self-pay | Admitting: Family Medicine

## 2016-07-25 DIAGNOSIS — R269 Unspecified abnormalities of gait and mobility: Secondary | ICD-10-CM

## 2016-07-30 ENCOUNTER — Ambulatory Visit
Admission: RE | Admit: 2016-07-30 | Discharge: 2016-07-30 | Disposition: A | Discharge status: Home / Self Care | Payer: Federal, State, Local not specified - PPO | Source: Ambulatory Visit | Attending: Family Medicine | Admitting: Family Medicine

## 2016-07-30 DIAGNOSIS — R2689 Other abnormalities of gait and mobility: Secondary | ICD-10-CM | POA: Diagnosis not present

## 2016-07-30 DIAGNOSIS — R269 Unspecified abnormalities of gait and mobility: Secondary | ICD-10-CM

## 2016-07-30 MED ORDER — IOPAMIDOL (ISOVUE-300) INJECTION 61%
75.0000 mL | Freq: Once | INTRAVENOUS | Status: AC | PRN
  Administered 2016-07-30: 75 mL via INTRAVENOUS

## 2016-08-05 ENCOUNTER — Ambulatory Visit (INDEPENDENT_AMBULATORY_CARE_PROVIDER_SITE_OTHER): Payer: Federal, State, Local not specified - PPO | Admitting: Cardiology

## 2016-08-05 ENCOUNTER — Encounter: Payer: Self-pay | Admitting: Cardiology

## 2016-08-05 VITALS — BP 110/70 | HR 85 | Ht 72.0 in | Wt 248.8 lb

## 2016-08-05 DIAGNOSIS — I10 Essential (primary) hypertension: Secondary | ICD-10-CM | POA: Diagnosis not present

## 2016-08-05 DIAGNOSIS — R55 Syncope and collapse: Secondary | ICD-10-CM

## 2016-08-05 DIAGNOSIS — R05 Cough: Secondary | ICD-10-CM | POA: Diagnosis not present

## 2016-08-05 DIAGNOSIS — R054 Cough syncope: Secondary | ICD-10-CM

## 2016-08-05 DIAGNOSIS — E782 Mixed hyperlipidemia: Secondary | ICD-10-CM | POA: Diagnosis not present

## 2016-08-05 MED ORDER — ROSUVASTATIN CALCIUM 40 MG PO TABS: 40.0000 mg | ORAL_TABLET | Freq: Every day | ORAL | 6 refills | Status: DC

## 2016-08-05 NOTE — Progress Notes (Signed)
Cardiology Office Note   Date:  08/05/2016   ID:  Gabriel SaupeHowell G Nagorski, DOB 1955-05-20, MRN [id number]  PCP:  Marinda ElkFried, Robert, MD  Cardiologist:   Regan LemmingWill Martin Candee Hoon, MD    Chief Complaint  Patient presents with  . Follow-up    Tussive Syncope     History of Present Illness: Gabriel Bean is a 61 y.o. male who presents today for cardiology evaluation.   He has been having issues of syncope which is exacerbated by coughing or laughing. He is a smoker and is not ready to quit. He has had posttussive syncope in the past, but his syncope characteristics have since changed. He says that he has near syncope when he stands up, after walking around for a couple minutes. He does not get dizzy, but does get lightheaded. This is a reproducible currents, though it does not happen every time he stands up and moves around.   Today, denies symptoms of palpitations, chest pain, shortness of breath, orthopnea, PND, lower extremity edema, claudication, dizziness, presyncope, bleeding, or neurologic sequela. The patient is tolerating medications without difficulties and is otherwise without complaint today.     Past Medical History:  Diagnosis Date  . Chronic rhinitis   . GERD (gastroesophageal reflux disease)   . History of skin cancer   . Hypertension   . Low libido   . OCD (obsessive compulsive disorder)   . OSA (obstructive sleep apnea)   . Seasonal allergies    Past Surgical History:  Procedure Laterality Date  . APPENDECTOMY    . COLONOSCOPY     x3  . SKIN CANCER EXCISION    . TONSILLECTOMY    . WISDOM TOOTH EXTRACTION       Current Outpatient Prescriptions  Medication Sig Dispense Refill  . allopurinol (ZYLOPRIM) 300 MG tablet Take 300 mg by mouth daily.    Marland Kitchen. amLODipine-olmesartan (AZOR) 5-40 MG tablet Take 1 tablet by mouth daily.    Marland Kitchen. atorvastatin (LIPITOR) 20 MG tablet Take 20 mg by mouth daily.  5  . buPROPion (WELLBUTRIN XL) 300 MG 24 hr tablet Take 1 tablet by mouth daily.     . DULoxetine (CYMBALTA) 60 MG capsule Take 120 mg by mouth daily.    Marland Kitchen. JANUVIA 100 MG tablet Take 100 mg by mouth daily.  5  . LANTUS SOLOSTAR 100 UNIT/ML Solostar Pen Inject 10 Units as directed at bedtime.  0  . metFORMIN (GLUCOPHAGE) 1000 MG tablet Take 1,000 mg by mouth 2 (two) times daily.  5  . metoprolol succinate (TOPROL-XL) 25 MG 24 hr tablet Take 25 mg by mouth daily.    Marland Kitchen. NASONEX 50 MCG/ACT nasal spray Place 2 sprays into the nose daily.    Marland Kitchen. OVER THE COUNTER MEDICATION Take 20 mg by mouth daily. [facility] brand acid reducer    . sildenafil (REVATIO) 20 MG tablet Take 20 mg by mouth daily as needed.    . rosuvastatin (CRESTOR) 40 MG tablet Take 1 tablet (40 mg total) by mouth daily. 30 tablet 6   No current facility-administered medications for this visit.     Allergies:   Patient has no known allergies.   Social History:  The patient  reports that he has been smoking Cigarettes.  He has been smoking about 1.00 pack per day. His smokeless tobacco use includes Snuff. He reports that he drinks alcohol. He reports that he does not use drugs.   Family History:  The patient's family history includes Cancer in  his father; Colon cancer in his father; Heart disease in his brother and father; Other in his mother; Skin cancer in his father.    ROS:  Please see the history of present illness.   Otherwise, review of systems is positive for Excessive for sweating, excessive fatigue, dyspnea on exertion, snoring, anxiety, balance problems, passing out.   All other systems are reviewed and negative.    PHYSICAL EXAM: VS:  BP 110/70   Pulse 85   Ht 6' (1.829 m)   Wt 248 lb 12.8 oz (112.9 kg)   BMI 33.74 kg/m  , BMI Body mass index is 33.74 kg/m. GEN: Well nourished, well developed, in no acute distress  HEENT: normal  Neck: no JVD, carotid bruits, or masses Cardiac: RRR; no murmurs, rubs, or gallops,no edema  Respiratory:  clear to auscultation bilaterally, normal work of  breathing GI: soft, nontender, nondistended, + BS MS: no deformity or atrophy  Skin: warm and dry Neuro:  Strength and sensation are intact Psych: euthymic mood, full affect  EKG:  EKG is ordered today. Personal review of the ekg ordered shows sinus rhythm, rate 85   Recent Labs: No results found for requested labs within last 8760 hours.    Lipid Panel  No results found for: CHOL, TRIG, HDL, CHOLHDL, VLDL, LDLCALC, LDLDIRECT   Wt Readings from Last 3 Encounters:  08/05/16 248 lb 12.8 oz (112.9 kg)  12/01/15 258 lb (117 kg)  08/13/11 264 lb 12.8 oz (120.1 kg)      Other studies Reviewed: Additional studies/ records that were reviewed today include: PCP notes  ASSESSMENT AND PLAN:  1.  Dizziness on standing: Could possibly be related to orthostatic changes, but with his near syncope, Tore Carreker plan for further evaluation. We'll order a 30 day monitor to determine if he has any arrhythmias.  2. Posttussive syncope: Minimal symptoms, but usually occurs with coughing. Continue current management.  3. Hyperlipidemia: Is currently on Lipitor 20 mg. He did have a CT scan that showed significant calcification in the vessels at the base of the brain. Plan to adjust his Lipitor to Crestor 40 mg. His triglycerides on his last check were significantly elevated.  4. Hypertension: Blood pressure is well-controlled today. No changes necessary.  5. Tobacco abuse: Currently not interested in quitting.    Current medicines are reviewed at length with the patient today.   The patient does not have concerns regarding his medicines.  The following changes were made today:  Stop Lipitor, start crestor  Labs/ tests ordered today include:  Orders Placed This Encounter  Procedures  . Cardiac event monitor  . EKG 12-Lead     Disposition:   FU with Johany Hansman 6 weeks  Signed, [employer] Jorja Loa, MD  08/05/2016 11:27 AM     [facility] [address] [place]  [place] [address] [phone] (office) [phone] (fax)

## 2016-08-05 NOTE — Patient Instructions (Addendum)
Medication Instructions:    Your physician has recommended you make the following change in your medication: 1) START Crestor 40 mg once daily  Labwork:  None ordered  Testing/Procedures: Your physician has recommended that you wear an event monitor. Event monitors are medical devices that record the heart's electrical activity. Doctors most often [place] these monitors to diagnose arrhythmias. Arrhythmias are problems with the speed or rhythm of the heartbeat. The monitor is a small, portable device. You can wear one while you do your normal daily activities. This is usually used to diagnose what is causing palpitations/syncope (passing out).  Follow-Up:  Your physician recommends that you schedule a follow-up appointment in: 6 weeks with Dr. Elberta Fortis (after event monitor is completed)  - If you need a refill on your cardiac medications before your next appointment, please call your pharmacy.   Thank you for choosing [facility]!!   Dory Horn, RN [phone]  Any Other Special Instructions Will Be Listed Below (If Applicable).   Cardiac Event Monitoring A cardiac event monitor is a small recording device that is used to detect abnormal heart rhythms (arrhythmias). The monitor is used to record your heart rhythm when you have symptoms, such as:  Fast heartbeats (palpitations), such as heart racing or fluttering.  Dizziness.  Fainting or light-headedness.  Unexplained weakness. Some monitors are wired to electrodes placed on your chest. Electrodes are flat, sticky disks that attach to your skin. Other monitors may be hand-held or worn on the wrist. The monitor can be worn for up to 30 days. If the monitor is attached to your chest, a technician will prepare your chest for the electrode placement and show you how to work the monitor. Take time to practice using the monitor before you leave the office. Make sure you understand how to send the information from the monitor to your  health care provider. In some cases, you may need to use a landline telephone instead of a cell phone. What are the risks? Generally, this device is safe to use, but it possible that the skin under the electrodes will become irritated. How to use your cardiac event monitor  Wear your monitor at all times, except when you are in water:  Do not let the monitor get wet.  Take the monitor off when you bathe. Do not swim or use a hot tub with it on.  Keep your skin clean. Do not put body lotion or moisturizer on your chest.  Change the electrodes as told by your health care provider or any time they stop sticking to your skin. You may need to use medical tape to keep them on.  Try to put the electrodes in slightly different places on your chest to help prevent skin irritation. They must remain in the area under your left breast and in the upper right section of your chest.  Make sure the monitor is safely clipped to your clothing or in a location close to your body that your health care provider recommends.  Press the button to record as soon as you feel heart-related symptoms, such as:  Dizziness.  Weakness.  Light-headedness.  Palpitations.  Thumping or pounding in your chest.  Shortness of breath.  Unexplained weakness.  Keep a diary of your activities, such as walking, doing chores, and taking medicine. It is very important to note what you were doing when you pushed the button to record your symptoms. This will help your health care provider determine what might be contributing  to your symptoms.  Send the recorded information as recommended by your health care provider. It may take some time for your health care provider to process the results.  Change the batteries as told by your health care provider.  Keep electronic devices away from your monitor. This includes:  Tablets.  MP3 players.  Cell phones.  While wearing your monitor you should avoid:  Electric  blankets.  Firefighter.  Electric toothbrushes.  Microwave ovens.  Magnets.  Metal detectors. Get help right away if:  You have chest pain.  You have extreme difficulty breathing or shortness of breath.  You develop a very fast heartbeat that persists.  You develop dizziness that does not go away.  You faint or constantly feel like you are about to faint. Summary  A cardiac event monitor is a small recording device that is used to help detect abnormal heart rhythms (arrhythmias).  The monitor is used to record your heart rhythm when you have heart-related symptoms.  Make sure you understand how to send the information from the monitor to your health care provider.  It is important to press the button on the monitor when you have any heart-related symptoms.  Keep a diary of your activities, such as walking, doing chores, and taking medicine. It is very important to note what you were doing when you pushed the button to record your symptoms. This will help your health care provider learn what might be causing your symptoms. This information is not intended to replace advice given to you by your health care provider. Make sure you discuss any questions you have with your health care provider. Document Released: 12/12/2007 Document Revised: 02/17/2016 Document Reviewed: 02/17/2016 Elsevier Interactive Patient Education  2017 Elsevier Inc.   Rosuvastatin Tablets What is this medicine? ROSUVASTATIN (roe SOO va sta tin) is known as a HMG-CoA reductase inhibitor or 'statin'. It lowers cholesterol and triglycerides in the blood. This drug may also reduce the risk of heart attack, stroke, or other health problems in patients with risk factors for heart disease. Diet and lifestyle changes are often used with this drug. This medicine may be used for other purposes; ask your health care provider or pharmacist if you have questions. COMMON BRAND NAME(S): Crestor What should I tell  my health care provider before I take this medicine? They need to know if you have any of these conditions: -frequently drink alcoholic beverages -kidney disease -liver disease -muscle aches or weakness -other medical condition -an unusual or allergic reaction to rosuvastatin, other medicines, foods, dyes, or preservatives -pregnant or trying to get pregnant -breast-feeding How should I use this medicine? Take this medicine by mouth with a glass of water. Follow the directions on the prescription label. Do not cut, crush or chew this medicine. You can take this medicine with or without food. Take your doses at regular intervals. Do not take your medicine more often than directed. Talk to your pediatrician regarding the use of this medicine in children. While this drug may be prescribed for children as young as 106 years old for selected conditions, precautions do apply. Overdosage: If you think you have taken too much of this medicine contact a poison control center or emergency room at once. NOTE: This medicine is only for you. Do not share this medicine with others. What if I miss a dose? If you miss a dose, take it as soon as you can. Do not take 2 doses within 12 hours of each other. If there  are less than 12 hours until your next dose, take only that dose. Do not take double or extra doses. What may interact with this medicine? Do not take this medicine with any of the following medications: -herbal medicines like red yeast rice This medicine may also interact with the following medications: -alcohol -antacids containing aluminum hydroxide or magnesium hydroxide -cyclosporine -other medicines for high cholesterol -some medicines for HIV infection -warfarin This list may not describe all possible interactions. Give your health care provider a list of all the medicines, herbs, non-prescription drugs, or dietary supplements you use. Also tell them if you smoke, drink alcohol, or use  illegal drugs. Some items may interact with your medicine. What should I watch for while using this medicine? Visit your doctor or health care professional for regular check-ups. You may need regular tests to make sure your liver is working properly. Tell your doctor or health care professional right away if you get any unexplained muscle pain, tenderness, or weakness, especially if you also have a fever and tiredness. Your doctor or health care professional may tell you to stop taking this medicine if you develop muscle problems. If your muscle problems do not go away after stopping this medicine, contact your health care professional. This medicine may affect blood sugar levels. If you have diabetes, check with your doctor or health care professional before you change your diet or the dose of your diabetic medicine. Avoid taking antacids containing aluminum, calcium or magnesium within 2 hours of taking this medicine. This drug is only part of a total heart-health program. Your doctor or a dietician can suggest a low-cholesterol and low-fat diet to help. Avoid alcohol and smoking, and keep a proper exercise schedule. Do not use this drug if you are pregnant or breast-feeding. Serious side effects to an unborn child or to an infant are possible. Talk to your doctor or pharmacist for more information. What side effects may I notice from receiving this medicine? Side effects that you should report to your doctor or health care professional as soon as possible: -allergic reactions like skin rash, itching or hives, swelling of the face, lips, or tongue -dark urine -fever -joint pain -muscle cramps, pain -redness, blistering, peeling or loosening of the skin, including inside the mouth -trouble passing urine or change in the amount of urine -unusually weak or tired -yellowing of the eyes or skin Side effects that usually do not require medical attention (report to your doctor or health care  professional if they continue or are bothersome): -constipation -heartburn -nausea -stomach gas, pain, upset This list may not describe all possible side effects. Call your doctor for medical advice about side effects. You may report side effects to FDA at 1-800-FDA-1088. Where should I keep my medicine? Keep out of the reach of children. Store at room temperature between 20 and 25 degrees C (68 and 77 degrees F). Keep container tightly closed (protect from moisture). Throw away any unused medicine after the expiration date. NOTE: This sheet is a summary. It may not cover all possible information. If you have questions about this medicine, talk to your doctor, pharmacist, or health care provider.  2018 Elsevier/Gold Standard (2014-08-18 13:33:08)

## 2016-08-08 ENCOUNTER — Ambulatory Visit (INDEPENDENT_AMBULATORY_CARE_PROVIDER_SITE_OTHER): Payer: Federal, State, Local not specified - PPO

## 2016-08-08 ENCOUNTER — Other Ambulatory Visit: Payer: Self-pay | Admitting: Cardiology

## 2016-08-08 DIAGNOSIS — R05 Cough: Secondary | ICD-10-CM

## 2016-08-08 DIAGNOSIS — R42 Dizziness and giddiness: Secondary | ICD-10-CM

## 2016-08-08 DIAGNOSIS — R55 Syncope and collapse: Secondary | ICD-10-CM

## 2016-08-08 DIAGNOSIS — R054 Cough syncope: Secondary | ICD-10-CM

## 2016-08-09 DIAGNOSIS — R269 Unspecified abnormalities of gait and mobility: Secondary | ICD-10-CM | POA: Diagnosis not present

## 2016-08-09 DIAGNOSIS — R55 Syncope and collapse: Secondary | ICD-10-CM | POA: Diagnosis not present

## 2016-08-09 DIAGNOSIS — Z794 Long term (current) use of insulin: Secondary | ICD-10-CM | POA: Diagnosis not present

## 2016-08-09 DIAGNOSIS — E7800 Pure hypercholesterolemia, unspecified: Secondary | ICD-10-CM | POA: Diagnosis not present

## 2016-08-09 DIAGNOSIS — F419 Anxiety disorder, unspecified: Secondary | ICD-10-CM | POA: Diagnosis not present

## 2016-08-09 DIAGNOSIS — I10 Essential (primary) hypertension: Secondary | ICD-10-CM | POA: Diagnosis not present

## 2016-09-05 DIAGNOSIS — F419 Anxiety disorder, unspecified: Secondary | ICD-10-CM | POA: Diagnosis not present

## 2016-09-05 DIAGNOSIS — G4700 Insomnia, unspecified: Secondary | ICD-10-CM | POA: Diagnosis not present

## 2016-09-05 DIAGNOSIS — I10 Essential (primary) hypertension: Secondary | ICD-10-CM | POA: Diagnosis not present

## 2016-09-05 DIAGNOSIS — E1165 Type 2 diabetes mellitus with hyperglycemia: Secondary | ICD-10-CM | POA: Diagnosis not present

## 2016-09-05 DIAGNOSIS — R269 Unspecified abnormalities of gait and mobility: Secondary | ICD-10-CM | POA: Diagnosis not present

## 2016-09-05 DIAGNOSIS — E7800 Pure hypercholesterolemia, unspecified: Secondary | ICD-10-CM | POA: Diagnosis not present

## 2016-09-05 DIAGNOSIS — R5383 Other fatigue: Secondary | ICD-10-CM | POA: Diagnosis not present

## 2016-09-17 ENCOUNTER — Ambulatory Visit (INDEPENDENT_AMBULATORY_CARE_PROVIDER_SITE_OTHER): Payer: Federal, State, Local not specified - PPO | Admitting: Cardiology

## 2016-09-17 ENCOUNTER — Encounter (INDEPENDENT_AMBULATORY_CARE_PROVIDER_SITE_OTHER): Payer: Self-pay

## 2016-09-17 ENCOUNTER — Encounter: Payer: Self-pay | Admitting: Cardiology

## 2016-09-17 VITALS — BP 142/82 | HR 76 | Ht 72.0 in | Wt 251.8 lb

## 2016-09-17 DIAGNOSIS — I10 Essential (primary) hypertension: Secondary | ICD-10-CM

## 2016-09-17 DIAGNOSIS — Z720 Tobacco use: Secondary | ICD-10-CM | POA: Diagnosis not present

## 2016-09-17 DIAGNOSIS — R054 Cough syncope: Secondary | ICD-10-CM

## 2016-09-17 DIAGNOSIS — R05 Cough: Secondary | ICD-10-CM

## 2016-09-17 MED ORDER — ROSUVASTATIN CALCIUM 5 MG PO TABS: 5.0000 mg | ORAL_TABLET | Freq: Every day | ORAL | 3 refills | Status: DC

## 2016-09-17 NOTE — Progress Notes (Signed)
Cardiology Office Note   Date:  09/17/2016   ID:  SHADRACH Bean, DOB [birth year], MRN [id number]  PCP:  Gabriel Ladd, MD  Cardiologist:   Regan Lemming, MD    Chief Complaint  Patient presents with  . Follow-up    Dizziness/Tussive Syncope     History of Present Illness: Gabriel Bean is a 61 y.o. male who presents today for cardiology evaluation.   He has been having issues of syncope which is exacerbated by coughing or laughing. He is a smoker and is not ready to quit. He has had posttussive syncope in the past, but his syncope characteristics have since changed. He says that he has near syncope when he stands up, after walking around for a couple minutes. He does not get dizzy, but does get lightheaded. This is a reproducible currents, though it does not happen every time he stands up and moves around.   Today, denies symptoms of palpitations, chest pain, shortness of breath, orthopnea, PND, lower extremity edema, claudication, dizziness, presyncope, syncope, bleeding, or neurologic sequela. The patient is tolerating medications without difficulties and is otherwise without complaint today. He has stopped taking his statin, and has adjusted his blood pressure medications. He is only taking 25 and metoprolol XL twice a day. He says that he is feeling better than he has in months. He passed out once while wearing the monitor, but has not had an episode of syncope since that time. He feels that his blood pressure dropped and that is the cause of his episode of syncope.    Past Medical History:  Diagnosis Date  . Chronic rhinitis   . GERD (gastroesophageal reflux disease)   . History of skin cancer   . Hypertension   . Low libido   . OCD (obsessive compulsive disorder)   . OSA (obstructive sleep apnea)   . Seasonal allergies    Past Surgical History:  Procedure Laterality Date  . APPENDECTOMY    . COLONOSCOPY     x3  . SKIN CANCER EXCISION    . TONSILLECTOMY    .  WISDOM TOOTH EXTRACTION       Current Outpatient Prescriptions  Medication Sig Dispense Refill  . allopurinol (ZYLOPRIM) 300 MG tablet Take 300 mg by mouth daily.    Marland Kitchen buPROPion (WELLBUTRIN XL) 300 MG 24 hr tablet Take 1 tablet by mouth daily.    . DULoxetine (CYMBALTA) 60 MG capsule Take 120 mg by mouth daily.    Marland Kitchen JANUVIA 100 MG tablet Take 100 mg by mouth daily.  5  . LANTUS SOLOSTAR 100 UNIT/ML Solostar Pen Inject 10 Units as directed at bedtime.  0  . metFORMIN (GLUCOPHAGE) 1000 MG tablet Take 1,000 mg by mouth 2 (two) times daily.  5  . metoprolol succinate (TOPROL-XL) 25 MG 24 hr tablet Take 25 mg by mouth daily.    Marland Kitchen NASONEX 50 MCG/ACT nasal spray Place 2 sprays into the nose daily.    Marland Kitchen OVER THE COUNTER MEDICATION Take 20 mg by mouth daily. [facility] brand acid reducer    . sildenafil (REVATIO) 20 MG tablet Take 20 mg by mouth daily as needed.     No current facility-administered medications for this visit.     Allergies:   Patient has no known allergies.   Social History:  The patient  reports that he has been smoking Cigarettes.  He has been smoking about 1.00 pack per day. His smokeless tobacco use includes Snuff. He reports  that he drinks alcohol. He reports that he does not use drugs.   Family History:  The patient's family history includes Cancer in his father; Colon cancer in his father; Heart disease in his brother and father; Other in his mother; Skin cancer in his father.    ROS:  Please see the history of present illness.   Otherwise, review of systems is positive for Dyspnea on exertion, balance problems, dizziness, passing out.   All other systems are reviewed and negative.     PHYSICAL EXAM: VS:  BP (!) 142/82   Pulse 76   Ht 6' (1.829 m)   Wt 251 lb 12.8 oz (114.2 kg)   BMI 34.15 kg/m  , BMI Body mass index is 34.15 kg/m. GEN: Well nourished, well developed, in no acute distress  HEENT: normal  Neck: no JVD, carotid bruits, or masses Cardiac: RRR; no  murmurs, rubs, or gallops,no edema  Respiratory:  clear to auscultation bilaterally, normal work of breathing GI: soft, nontender, nondistended, + BS MS: no deformity or atrophy  Skin: warm and dry Neuro:  Strength and sensation are intact Psych: euthymic mood, full affect  EKG:  EKG is not ordered today. Personal review of the ekg ordered 08/05/16 shows sinus rhythm, rate 85  Recent Labs: No results found for requested labs within last 8760 hours.    Lipid Panel  No results found for: CHOL, TRIG, HDL, CHOLHDL, VLDL, LDLCALC, LDLDIRECT   Wt Readings from Last 3 Encounters:  09/17/16 251 lb 12.8 oz (114.2 kg)  08/05/16 248 lb 12.8 oz (112.9 kg)  12/01/15 258 lb (117 kg)      Other studies Reviewed: Additional studies/ records that were reviewed today include: 30 day monitor - personally reviewed  Sinus rhythm and sinus tachycardia Symptoms of lightheadedness and passing out associated with sinus rhythm rates 90-110 No arrhythmias seen  ASSESSMENT AND PLAN:  1.  Dizziness on standing: No arrhythmias seen on the monitor. It is possible this is simply orthostatic. No further workup at this time.  2. 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 Did have an episode while wearing the monitor that he attributes to hypotension as he has not had any further episodes since his blood pressure medicines have been adjusted. No need to restrict driving.  3. Hyperlipidemia: Did not tolerate atorvastatin. We'll switch him to 5 mg of Crestor. This can be adjusted by his primary physician.  4. Hypertension: Mildly elevated today. Care per primary physician.  5. Tobacco abuse: Has expressed an interest in quitting. He says that he Lareta Bruneau try in the coming months.    Current medicines are reviewed at length with the patient today.   The patient does not have concerns regarding his medicines.  The following changes were made today:  crestor  Labs/ tests ordered today  include:  No orders of the defined types were placed in this encounter.    Disposition:   FU with Rashawna Scoles 12 years  Signed, Massie Cogliano Jorja LoaMartin Jakira Mcfadden, MD  09/17/2016 2:32 PM     [facility] [address] [place] [address] [phone] (office) [phone] (fax)

## 2016-09-26 DIAGNOSIS — I10 Essential (primary) hypertension: Secondary | ICD-10-CM | POA: Diagnosis not present

## 2016-09-26 DIAGNOSIS — Z789 Other specified health status: Secondary | ICD-10-CM | POA: Diagnosis not present

## 2016-09-26 DIAGNOSIS — E7800 Pure hypercholesterolemia, unspecified: Secondary | ICD-10-CM | POA: Diagnosis not present

## 2016-09-26 DIAGNOSIS — E1165 Type 2 diabetes mellitus with hyperglycemia: Secondary | ICD-10-CM | POA: Diagnosis not present

## 2016-10-23 DIAGNOSIS — I10 Essential (primary) hypertension: Secondary | ICD-10-CM | POA: Diagnosis not present

## 2016-10-23 DIAGNOSIS — E7800 Pure hypercholesterolemia, unspecified: Secondary | ICD-10-CM | POA: Diagnosis not present

## 2016-10-23 DIAGNOSIS — E1165 Type 2 diabetes mellitus with hyperglycemia: Secondary | ICD-10-CM | POA: Diagnosis not present

## 2016-11-28 DIAGNOSIS — F419 Anxiety disorder, unspecified: Secondary | ICD-10-CM | POA: Diagnosis not present

## 2016-11-28 DIAGNOSIS — E7800 Pure hypercholesterolemia, unspecified: Secondary | ICD-10-CM | POA: Diagnosis not present

## 2016-11-28 DIAGNOSIS — Z23 Encounter for immunization: Secondary | ICD-10-CM | POA: Diagnosis not present

## 2016-11-28 DIAGNOSIS — E118 Type 2 diabetes mellitus with unspecified complications: Secondary | ICD-10-CM | POA: Diagnosis not present

## 2016-12-02 DIAGNOSIS — E291 Testicular hypofunction: Secondary | ICD-10-CM | POA: Diagnosis not present

## 2016-12-02 DIAGNOSIS — E1165 Type 2 diabetes mellitus with hyperglycemia: Secondary | ICD-10-CM | POA: Diagnosis not present

## 2016-12-02 DIAGNOSIS — L818 Other specified disorders of pigmentation: Secondary | ICD-10-CM | POA: Diagnosis not present

## 2016-12-02 DIAGNOSIS — Z125 Encounter for screening for malignant neoplasm of prostate: Secondary | ICD-10-CM | POA: Diagnosis not present

## 2016-12-02 DIAGNOSIS — Z0000 Encounter for general adult medical examination without abnormal findings: Secondary | ICD-10-CM | POA: Diagnosis not present

## 2016-12-02 DIAGNOSIS — F17200 Nicotine dependence, unspecified, uncomplicated: Secondary | ICD-10-CM | POA: Diagnosis not present

## 2016-12-02 DIAGNOSIS — E790 Hyperuricemia without signs of inflammatory arthritis and tophaceous disease: Secondary | ICD-10-CM | POA: Diagnosis not present

## 2016-12-02 DIAGNOSIS — E7800 Pure hypercholesterolemia, unspecified: Secondary | ICD-10-CM | POA: Diagnosis not present

## 2017-01-16 DIAGNOSIS — E7800 Pure hypercholesterolemia, unspecified: Secondary | ICD-10-CM | POA: Diagnosis not present

## 2017-01-16 DIAGNOSIS — E1165 Type 2 diabetes mellitus with hyperglycemia: Secondary | ICD-10-CM | POA: Diagnosis not present

## 2017-01-23 DIAGNOSIS — E7800 Pure hypercholesterolemia, unspecified: Secondary | ICD-10-CM | POA: Diagnosis not present

## 2017-01-23 DIAGNOSIS — I10 Essential (primary) hypertension: Secondary | ICD-10-CM | POA: Diagnosis not present

## 2017-01-23 DIAGNOSIS — E1165 Type 2 diabetes mellitus with hyperglycemia: Secondary | ICD-10-CM | POA: Diagnosis not present

## 2017-04-04 DIAGNOSIS — E7800 Pure hypercholesterolemia, unspecified: Secondary | ICD-10-CM | POA: Diagnosis not present

## 2017-04-04 DIAGNOSIS — Z789 Other specified health status: Secondary | ICD-10-CM | POA: Diagnosis not present

## 2017-04-04 DIAGNOSIS — E790 Hyperuricemia without signs of inflammatory arthritis and tophaceous disease: Secondary | ICD-10-CM | POA: Diagnosis not present

## 2017-04-04 DIAGNOSIS — E1165 Type 2 diabetes mellitus with hyperglycemia: Secondary | ICD-10-CM | POA: Diagnosis not present

## 2017-06-03 DIAGNOSIS — Z789 Other specified health status: Secondary | ICD-10-CM | POA: Diagnosis not present

## 2017-06-03 DIAGNOSIS — E790 Hyperuricemia without signs of inflammatory arthritis and tophaceous disease: Secondary | ICD-10-CM | POA: Diagnosis not present

## 2017-06-03 DIAGNOSIS — E118 Type 2 diabetes mellitus with unspecified complications: Secondary | ICD-10-CM | POA: Diagnosis not present

## 2017-06-03 DIAGNOSIS — E7800 Pure hypercholesterolemia, unspecified: Secondary | ICD-10-CM | POA: Diagnosis not present

## 2017-07-01 DIAGNOSIS — E118 Type 2 diabetes mellitus with unspecified complications: Secondary | ICD-10-CM | POA: Diagnosis not present

## 2017-07-01 DIAGNOSIS — E7800 Pure hypercholesterolemia, unspecified: Secondary | ICD-10-CM | POA: Diagnosis not present

## 2017-07-01 DIAGNOSIS — I10 Essential (primary) hypertension: Secondary | ICD-10-CM | POA: Diagnosis not present

## 2017-07-01 DIAGNOSIS — Z789 Other specified health status: Secondary | ICD-10-CM | POA: Diagnosis not present

## 2017-07-16 DIAGNOSIS — E7800 Pure hypercholesterolemia, unspecified: Secondary | ICD-10-CM | POA: Diagnosis not present

## 2017-07-16 DIAGNOSIS — E1165 Type 2 diabetes mellitus with hyperglycemia: Secondary | ICD-10-CM | POA: Diagnosis not present

## 2017-07-23 DIAGNOSIS — E1165 Type 2 diabetes mellitus with hyperglycemia: Secondary | ICD-10-CM | POA: Diagnosis not present

## 2017-07-23 DIAGNOSIS — E7800 Pure hypercholesterolemia, unspecified: Secondary | ICD-10-CM | POA: Diagnosis not present

## 2017-07-23 DIAGNOSIS — I10 Essential (primary) hypertension: Secondary | ICD-10-CM | POA: Diagnosis not present

## 2017-07-29 DIAGNOSIS — E118 Type 2 diabetes mellitus with unspecified complications: Secondary | ICD-10-CM | POA: Diagnosis not present

## 2017-07-29 DIAGNOSIS — E7800 Pure hypercholesterolemia, unspecified: Secondary | ICD-10-CM | POA: Diagnosis not present

## 2017-07-29 DIAGNOSIS — Z789 Other specified health status: Secondary | ICD-10-CM | POA: Diagnosis not present

## 2017-07-29 DIAGNOSIS — I10 Essential (primary) hypertension: Secondary | ICD-10-CM | POA: Diagnosis not present

## 2017-07-30 DIAGNOSIS — E119 Type 2 diabetes mellitus without complications: Secondary | ICD-10-CM | POA: Diagnosis not present

## 2017-08-29 DIAGNOSIS — I10 Essential (primary) hypertension: Secondary | ICD-10-CM | POA: Diagnosis not present

## 2017-08-29 DIAGNOSIS — E7800 Pure hypercholesterolemia, unspecified: Secondary | ICD-10-CM | POA: Diagnosis not present

## 2017-08-29 DIAGNOSIS — Z789 Other specified health status: Secondary | ICD-10-CM | POA: Diagnosis not present

## 2017-08-29 DIAGNOSIS — E118 Type 2 diabetes mellitus with unspecified complications: Secondary | ICD-10-CM | POA: Diagnosis not present

## 2017-10-21 DIAGNOSIS — E1165 Type 2 diabetes mellitus with hyperglycemia: Secondary | ICD-10-CM | POA: Diagnosis not present

## 2017-10-21 DIAGNOSIS — E7800 Pure hypercholesterolemia, unspecified: Secondary | ICD-10-CM | POA: Diagnosis not present

## 2017-10-21 DIAGNOSIS — I10 Essential (primary) hypertension: Secondary | ICD-10-CM | POA: Diagnosis not present

## 2017-11-04 ENCOUNTER — Other Ambulatory Visit: Payer: Self-pay | Admitting: Geriatric Medicine

## 2017-11-04 DIAGNOSIS — I10 Essential (primary) hypertension: Secondary | ICD-10-CM | POA: Diagnosis not present

## 2017-11-04 DIAGNOSIS — F17210 Nicotine dependence, cigarettes, uncomplicated: Secondary | ICD-10-CM | POA: Diagnosis not present

## 2017-11-04 DIAGNOSIS — E118 Type 2 diabetes mellitus with unspecified complications: Secondary | ICD-10-CM | POA: Diagnosis not present

## 2017-11-12 ENCOUNTER — Ambulatory Visit
Admission: RE | Admit: 2017-11-12 | Discharge: 2017-11-12 | Disposition: A | Discharge status: Home / Self Care | Payer: Federal, State, Local not specified - PPO | Source: Ambulatory Visit | Attending: Geriatric Medicine | Admitting: Geriatric Medicine

## 2017-11-12 DIAGNOSIS — F17210 Nicotine dependence, cigarettes, uncomplicated: Secondary | ICD-10-CM | POA: Diagnosis not present

## 2018-01-07 DIAGNOSIS — E1142 Type 2 diabetes mellitus with diabetic polyneuropathy: Secondary | ICD-10-CM | POA: Diagnosis not present

## 2018-01-07 DIAGNOSIS — I10 Essential (primary) hypertension: Secondary | ICD-10-CM | POA: Diagnosis not present

## 2018-01-07 DIAGNOSIS — Z23 Encounter for immunization: Secondary | ICD-10-CM | POA: Diagnosis not present

## 2018-02-19 DIAGNOSIS — E7800 Pure hypercholesterolemia, unspecified: Secondary | ICD-10-CM | POA: Diagnosis not present

## 2018-02-19 DIAGNOSIS — E1165 Type 2 diabetes mellitus with hyperglycemia: Secondary | ICD-10-CM | POA: Diagnosis not present

## 2018-02-26 DIAGNOSIS — I10 Essential (primary) hypertension: Secondary | ICD-10-CM | POA: Diagnosis not present

## 2018-02-26 DIAGNOSIS — E7800 Pure hypercholesterolemia, unspecified: Secondary | ICD-10-CM | POA: Diagnosis not present

## 2018-02-26 DIAGNOSIS — E1165 Type 2 diabetes mellitus with hyperglycemia: Secondary | ICD-10-CM | POA: Diagnosis not present

## 2018-06-30 DIAGNOSIS — I10 Essential (primary) hypertension: Secondary | ICD-10-CM | POA: Diagnosis not present

## 2018-06-30 DIAGNOSIS — E1142 Type 2 diabetes mellitus with diabetic polyneuropathy: Secondary | ICD-10-CM | POA: Diagnosis not present

## 2018-07-02 DIAGNOSIS — I10 Essential (primary) hypertension: Secondary | ICD-10-CM | POA: Diagnosis not present

## 2018-07-02 DIAGNOSIS — E1165 Type 2 diabetes mellitus with hyperglycemia: Secondary | ICD-10-CM | POA: Diagnosis not present

## 2018-07-02 DIAGNOSIS — E7800 Pure hypercholesterolemia, unspecified: Secondary | ICD-10-CM | POA: Diagnosis not present

## 2018-11-30 DIAGNOSIS — Z23 Encounter for immunization: Secondary | ICD-10-CM | POA: Diagnosis not present

## 2018-11-30 DIAGNOSIS — Z0000 Encounter for general adult medical examination without abnormal findings: Secondary | ICD-10-CM | POA: Diagnosis not present

## 2018-11-30 DIAGNOSIS — Z125 Encounter for screening for malignant neoplasm of prostate: Secondary | ICD-10-CM | POA: Diagnosis not present

## 2018-12-16 DIAGNOSIS — E119 Type 2 diabetes mellitus without complications: Secondary | ICD-10-CM | POA: Diagnosis not present

## 2018-12-21 DIAGNOSIS — G4733 Obstructive sleep apnea (adult) (pediatric): Secondary | ICD-10-CM | POA: Diagnosis not present

## 2018-12-24 DIAGNOSIS — E1165 Type 2 diabetes mellitus with hyperglycemia: Secondary | ICD-10-CM | POA: Diagnosis not present

## 2018-12-24 DIAGNOSIS — E7800 Pure hypercholesterolemia, unspecified: Secondary | ICD-10-CM | POA: Diagnosis not present

## 2018-12-31 DIAGNOSIS — E7800 Pure hypercholesterolemia, unspecified: Secondary | ICD-10-CM | POA: Diagnosis not present

## 2018-12-31 DIAGNOSIS — Z9641 Presence of insulin pump (external) (internal): Secondary | ICD-10-CM | POA: Diagnosis not present

## 2018-12-31 DIAGNOSIS — E1165 Type 2 diabetes mellitus with hyperglycemia: Secondary | ICD-10-CM | POA: Diagnosis not present

## 2018-12-31 DIAGNOSIS — I10 Essential (primary) hypertension: Secondary | ICD-10-CM | POA: Diagnosis not present

## 2019-01-04 DIAGNOSIS — G4733 Obstructive sleep apnea (adult) (pediatric): Secondary | ICD-10-CM | POA: Diagnosis not present

## 2019-02-04 DIAGNOSIS — G4733 Obstructive sleep apnea (adult) (pediatric): Secondary | ICD-10-CM | POA: Diagnosis not present

## 2019-02-09 DIAGNOSIS — G4733 Obstructive sleep apnea (adult) (pediatric): Secondary | ICD-10-CM | POA: Diagnosis not present

## 2019-03-06 DIAGNOSIS — G4733 Obstructive sleep apnea (adult) (pediatric): Secondary | ICD-10-CM | POA: Diagnosis not present

## 2019-03-18 DIAGNOSIS — G4733 Obstructive sleep apnea (adult) (pediatric): Secondary | ICD-10-CM | POA: Diagnosis not present

## 2019-03-29 DIAGNOSIS — G4733 Obstructive sleep apnea (adult) (pediatric): Secondary | ICD-10-CM | POA: Diagnosis not present

## 2019-03-31 DIAGNOSIS — Z23 Encounter for immunization: Secondary | ICD-10-CM | POA: Diagnosis not present

## 2019-04-06 DIAGNOSIS — G4733 Obstructive sleep apnea (adult) (pediatric): Secondary | ICD-10-CM | POA: Diagnosis not present

## 2019-05-07 DIAGNOSIS — G4733 Obstructive sleep apnea (adult) (pediatric): Secondary | ICD-10-CM | POA: Diagnosis not present

## 2019-06-04 DIAGNOSIS — G4733 Obstructive sleep apnea (adult) (pediatric): Secondary | ICD-10-CM | POA: Diagnosis not present

## 2019-06-07 DIAGNOSIS — I10 Essential (primary) hypertension: Secondary | ICD-10-CM | POA: Diagnosis not present

## 2019-06-07 DIAGNOSIS — E1142 Type 2 diabetes mellitus with diabetic polyneuropathy: Secondary | ICD-10-CM | POA: Diagnosis not present

## 2019-06-07 DIAGNOSIS — F17210 Nicotine dependence, cigarettes, uncomplicated: Secondary | ICD-10-CM | POA: Diagnosis not present

## 2019-06-07 DIAGNOSIS — I700 Atherosclerosis of aorta: Secondary | ICD-10-CM | POA: Diagnosis not present

## 2019-06-14 ENCOUNTER — Other Ambulatory Visit: Payer: Self-pay | Admitting: Geriatric Medicine

## 2019-06-14 DIAGNOSIS — F17210 Nicotine dependence, cigarettes, uncomplicated: Secondary | ICD-10-CM

## 2019-06-16 DIAGNOSIS — G4733 Obstructive sleep apnea (adult) (pediatric): Secondary | ICD-10-CM | POA: Diagnosis not present

## 2019-06-29 ENCOUNTER — Ambulatory Visit
Admission: RE | Admit: 2019-06-29 | Discharge: 2019-06-29 | Disposition: A | Discharge status: Home / Self Care | Payer: Federal, State, Local not specified - PPO | Source: Ambulatory Visit | Attending: Geriatric Medicine | Admitting: Geriatric Medicine

## 2019-06-29 DIAGNOSIS — F17210 Nicotine dependence, cigarettes, uncomplicated: Secondary | ICD-10-CM

## 2019-07-01 ENCOUNTER — Inpatient Hospital Stay (HOSPITAL_COMMUNITY)
Admission: EM | Admit: 2019-07-01 | Discharge: 2019-07-02 | DRG: 247 | Disposition: A | Discharge status: Home / Self Care | Payer: Federal, State, Local not specified - PPO | Attending: Cardiology | Admitting: Cardiology

## 2019-07-01 ENCOUNTER — Other Ambulatory Visit: Payer: Self-pay

## 2019-07-01 ENCOUNTER — Inpatient Hospital Stay (HOSPITAL_COMMUNITY): Payer: Federal, State, Local not specified - PPO

## 2019-07-01 ENCOUNTER — Encounter (HOSPITAL_COMMUNITY): Payer: Self-pay | Admitting: *Deleted

## 2019-07-01 ENCOUNTER — Emergency Department (HOSPITAL_COMMUNITY): Payer: Federal, State, Local not specified - PPO

## 2019-07-01 ENCOUNTER — Inpatient Hospital Stay (HOSPITAL_COMMUNITY)
Admission: EM | Disposition: A | Discharge status: Home / Self Care | Payer: Self-pay | Source: Home / Self Care | Attending: Cardiology

## 2019-07-01 DIAGNOSIS — I2510 Atherosclerotic heart disease of native coronary artery without angina pectoris: Secondary | ICD-10-CM

## 2019-07-01 DIAGNOSIS — F429 Obsessive-compulsive disorder, unspecified: Secondary | ICD-10-CM | POA: Diagnosis not present

## 2019-07-01 DIAGNOSIS — Z85828 Personal history of other malignant neoplasm of skin: Secondary | ICD-10-CM | POA: Diagnosis not present

## 2019-07-01 DIAGNOSIS — R079 Chest pain, unspecified: Secondary | ICD-10-CM | POA: Diagnosis present

## 2019-07-01 DIAGNOSIS — I213 ST elevation (STEMI) myocardial infarction of unspecified site: Secondary | ICD-10-CM

## 2019-07-01 DIAGNOSIS — I2109 ST elevation (STEMI) myocardial infarction involving other coronary artery of anterior wall: Secondary | ICD-10-CM | POA: Diagnosis not present

## 2019-07-01 DIAGNOSIS — I214 Non-ST elevation (NSTEMI) myocardial infarction: Secondary | ICD-10-CM

## 2019-07-01 DIAGNOSIS — K219 Gastro-esophageal reflux disease without esophagitis: Secondary | ICD-10-CM | POA: Diagnosis not present

## 2019-07-01 DIAGNOSIS — Z6835 Body mass index (BMI) 35.0-35.9, adult: Secondary | ICD-10-CM

## 2019-07-01 DIAGNOSIS — E7800 Pure hypercholesterolemia, unspecified: Secondary | ICD-10-CM | POA: Diagnosis present

## 2019-07-01 DIAGNOSIS — F39 Unspecified mood [affective] disorder: Secondary | ICD-10-CM | POA: Diagnosis not present

## 2019-07-01 DIAGNOSIS — E119 Type 2 diabetes mellitus without complications: Secondary | ICD-10-CM | POA: Diagnosis not present

## 2019-07-01 DIAGNOSIS — E669 Obesity, unspecified: Secondary | ICD-10-CM | POA: Diagnosis not present

## 2019-07-01 DIAGNOSIS — Z20822 Contact with and (suspected) exposure to covid-19: Secondary | ICD-10-CM | POA: Diagnosis not present

## 2019-07-01 DIAGNOSIS — Z955 Presence of coronary angioplasty implant and graft: Secondary | ICD-10-CM | POA: Diagnosis not present

## 2019-07-01 DIAGNOSIS — Z800 Family history of malignant neoplasm of digestive organs: Secondary | ICD-10-CM

## 2019-07-01 DIAGNOSIS — Z9641 Presence of insulin pump (external) (internal): Secondary | ICD-10-CM | POA: Diagnosis present

## 2019-07-01 DIAGNOSIS — E1169 Type 2 diabetes mellitus with other specified complication: Secondary | ICD-10-CM

## 2019-07-01 DIAGNOSIS — Z794 Long term (current) use of insulin: Secondary | ICD-10-CM

## 2019-07-01 DIAGNOSIS — R0789 Other chest pain: Secondary | ICD-10-CM | POA: Diagnosis not present

## 2019-07-01 DIAGNOSIS — Z8249 Family history of ischemic heart disease and other diseases of the circulatory system: Secondary | ICD-10-CM | POA: Diagnosis not present

## 2019-07-01 DIAGNOSIS — I25110 Atherosclerotic heart disease of native coronary artery with unstable angina pectoris: Secondary | ICD-10-CM | POA: Diagnosis present

## 2019-07-01 DIAGNOSIS — Z720 Tobacco use: Secondary | ICD-10-CM

## 2019-07-01 DIAGNOSIS — I10 Essential (primary) hypertension: Secondary | ICD-10-CM

## 2019-07-01 DIAGNOSIS — E1165 Type 2 diabetes mellitus with hyperglycemia: Secondary | ICD-10-CM | POA: Diagnosis not present

## 2019-07-01 DIAGNOSIS — Z79899 Other long term (current) drug therapy: Secondary | ICD-10-CM

## 2019-07-01 DIAGNOSIS — Z808 Family history of malignant neoplasm of other organs or systems: Secondary | ICD-10-CM

## 2019-07-01 DIAGNOSIS — I200 Unstable angina: Secondary | ICD-10-CM | POA: Diagnosis not present

## 2019-07-01 DIAGNOSIS — G4733 Obstructive sleep apnea (adult) (pediatric): Secondary | ICD-10-CM | POA: Diagnosis not present

## 2019-07-01 DIAGNOSIS — I361 Nonrheumatic tricuspid (valve) insufficiency: Secondary | ICD-10-CM

## 2019-07-01 DIAGNOSIS — F17210 Nicotine dependence, cigarettes, uncomplicated: Secondary | ICD-10-CM | POA: Diagnosis not present

## 2019-07-01 DIAGNOSIS — I255 Ischemic cardiomyopathy: Secondary | ICD-10-CM | POA: Diagnosis not present

## 2019-07-01 DIAGNOSIS — E1159 Type 2 diabetes mellitus with other circulatory complications: Secondary | ICD-10-CM | POA: Diagnosis not present

## 2019-07-01 HISTORY — PX: LEFT HEART CATH AND CORONARY ANGIOGRAPHY: CATH118249

## 2019-07-01 HISTORY — DX: Atherosclerotic heart disease of native coronary artery without angina pectoris: I25.10

## 2019-07-01 HISTORY — PX: CORONARY STENT INTERVENTION: CATH118234

## 2019-07-01 LAB — CREATININE, SERUM
Creatinine, Ser: 1.15 mg/dL (ref 0.61–1.24)
GFR calc Af Amer: >60 mL/min (ref >60)
GFR calc non Af Amer: >60 mL/min (ref >60)

## 2019-07-01 LAB — CBC
HCT: 42.2 % (ref 39.0–52.0)
HCT: 41.1 % (ref 39.0–52.0)
Hemoglobin: 14 g/dL (ref 13.0–17.0)
Hemoglobin: 13.8 g/dL (ref 13.0–17.0)
MCH: 31.4 pg (ref 26.0–34.0)
MCH: 31.9 pg (ref 26.0–34.0)
MCHC: 33.2 g/dL (ref 30.0–36.0)
MCHC: 33.6 g/dL (ref 30.0–36.0)
MCV: 94.6 fL (ref 80.0–100.0)
MCV: 95.1 fL (ref 80.0–100.0)
Platelets: 200 10*3/uL (ref 150–400)
Platelets: 205 10*3/uL (ref 150–400)
RBC: 4.46 MIL/uL (ref 4.22–5.81)
RBC: 4.32 MIL/uL (ref 4.22–5.81)
RDW: 13.7 % (ref 11.5–15.5)
RDW: 13.9 % (ref 11.5–15.5)
WBC: 12.8 10*3/uL — ABNORMAL HIGH (ref 4.0–10.5)
WBC: 13.6 10*3/uL — ABNORMAL HIGH (ref 4.0–10.5)
nRBC: 0 % (ref 0.0–0.2)
nRBC: 0 % (ref 0.0–0.2)

## 2019-07-01 LAB — LIPID PANEL
Cholesterol: 158 mg/dL (ref 0–200)
HDL: 53 mg/dL (ref >40)
LDL Cholesterol: 95 mg/dL (ref 0–99)
Total CHOL/HDL Ratio: 3 RATIO
Triglycerides: 48 mg/dL (ref <150)
VLDL: 10 mg/dL (ref 0–40)

## 2019-07-01 LAB — RESPIRATORY PANEL BY RT PCR (FLU A&B, COVID)
Influenza A by PCR: NEGATIVE
Influenza B by PCR: NEGATIVE
SARS Coronavirus 2 by RT PCR: NEGATIVE

## 2019-07-01 LAB — BASIC METABOLIC PANEL
Anion gap: 11 (ref 5–15)
BUN: 14 mg/dL (ref 8–23)
CO2: 21 mmol/L — ABNORMAL LOW (ref 22–32)
Calcium: 8.8 mg/dL — ABNORMAL LOW (ref 8.9–10.3)
Chloride: 103 mmol/L (ref 98–111)
Creatinine, Ser: 1.22 mg/dL (ref 0.61–1.24)
GFR calc Af Amer: >60 mL/min (ref >60)
GFR calc non Af Amer: >60 mL/min (ref >60)
Glucose, Bld: 179 mg/dL — ABNORMAL HIGH (ref 70–99)
Potassium: 4.2 mmol/L (ref 3.5–5.1)
Sodium: 135 mmol/L (ref 135–145)

## 2019-07-01 LAB — I-STAT CHEM 8, ED
BUN: 19 mg/dL (ref 8–23)
Calcium, Ion: 1.13 mmol/L — ABNORMAL LOW (ref 1.15–1.40)
Chloride: 102 mmol/L (ref 98–111)
Creatinine, Ser: 1.4 mg/dL — ABNORMAL HIGH (ref 0.61–1.24)
Glucose, Bld: 202 mg/dL — ABNORMAL HIGH (ref 70–99)
HCT: 45 % (ref 39.0–52.0)
Hemoglobin: 15.3 g/dL (ref 13.0–17.0)
Potassium: 4.6 mmol/L (ref 3.5–5.1)
Sodium: 137 mmol/L (ref 135–145)
TCO2: 30 mmol/L (ref 22–32)

## 2019-07-01 LAB — ECHOCARDIOGRAM COMPLETE
Height: 72 in
Weight: 4240 oz

## 2019-07-01 LAB — HEMOGLOBIN A1C
Hgb A1c MFr Bld: 7 % — ABNORMAL HIGH (ref 4.8–5.6)
Mean Plasma Glucose: 154.2 mg/dL

## 2019-07-01 LAB — POCT ACTIVATED CLOTTING TIME: Activated Clotting Time: 290 seconds

## 2019-07-01 LAB — TSH: TSH: 4.53 u[IU]/mL — ABNORMAL HIGH (ref 0.350–4.500)

## 2019-07-01 LAB — HEPARIN LEVEL (UNFRACTIONATED): Heparin Unfractionated: 0.52 IU/mL (ref 0.30–0.70)

## 2019-07-01 LAB — TROPONIN I (HIGH SENSITIVITY)
Troponin I (High Sensitivity): >27000 ng/L (ref <18)
Troponin I (High Sensitivity): >27000 ng/L (ref <18)
Troponin I (High Sensitivity): >27000 ng/L (ref <18)

## 2019-07-01 LAB — GLUCOSE, CAPILLARY
Glucose-Capillary: 172 mg/dL — ABNORMAL HIGH (ref 70–99)
Glucose-Capillary: 135 mg/dL — ABNORMAL HIGH (ref 70–99)

## 2019-07-01 LAB — CBG MONITORING, ED: Glucose-Capillary: 155 mg/dL — ABNORMAL HIGH (ref 70–99)

## 2019-07-01 SURGERY — LEFT HEART CATH AND CORONARY ANGIOGRAPHY
Anesthesia: LOCAL

## 2019-07-01 MED ORDER — HEPARIN (PORCINE) IN NACL 1000-0.9 UT/500ML-% IV SOLN
INTRAVENOUS | Status: DC | PRN
  Administered 2019-07-01: 500 mL

## 2019-07-01 MED ORDER — FENTANYL CITRATE (PF) 100 MCG/2ML IJ SOLN
100.0000 ug | Freq: Once | INTRAMUSCULAR | Status: AC
  Administered 2019-07-01: 100 ug via INTRAVENOUS

## 2019-07-01 MED ORDER — TICAGRELOR 90 MG PO TABS
ORAL_TABLET | ORAL | Status: DC | PRN
  Administered 2019-07-01: 180 mg via ORAL

## 2019-07-01 MED ORDER — HYDRALAZINE HCL 20 MG/ML IJ SOLN: 10.0000 mg | INTRAMUSCULAR | Status: AC | PRN

## 2019-07-01 MED ORDER — HEPARIN SODIUM (PORCINE) 1000 UNIT/ML IJ SOLN
INTRAMUSCULAR | Status: AC
  Filled 2019-07-01: qty 1

## 2019-07-01 MED ORDER — TICAGRELOR 90 MG PO TABS
ORAL_TABLET | ORAL | Status: AC
  Filled 2019-07-01: qty 1

## 2019-07-01 MED ORDER — SODIUM CHLORIDE 0.9% FLUSH: 3.0000 mL | Freq: Two times a day (BID) | INTRAVENOUS | Status: DC

## 2019-07-01 MED ORDER — SODIUM CHLORIDE 0.9 % IV SOLN: INTRAVENOUS | Status: DC

## 2019-07-01 MED ORDER — ONDANSETRON HCL 4 MG/2ML IJ SOLN
4.0000 mg | Freq: Once | INTRAMUSCULAR | Status: AC
  Administered 2019-07-01: 4 mg via INTRAVENOUS

## 2019-07-01 MED ORDER — LIDOCAINE HCL (PF) 1 % IJ SOLN
INTRAMUSCULAR | Status: AC
  Filled 2019-07-01: qty 30

## 2019-07-01 MED ORDER — HEPARIN SODIUM (PORCINE) 5000 UNIT/ML IJ SOLN
INTRAMUSCULAR | Status: AC
  Filled 2019-07-01: qty 1

## 2019-07-01 MED ORDER — METOPROLOL TARTRATE 12.5 MG HALF TABLET: 12.5000 mg | ORAL_TABLET | Freq: Two times a day (BID) | ORAL | Status: DC

## 2019-07-01 MED ORDER — HEPARIN (PORCINE) IN NACL 1000-0.9 UT/500ML-% IV SOLN
INTRAVENOUS | Status: AC
  Filled 2019-07-01: qty 1000

## 2019-07-01 MED ORDER — MIDAZOLAM HCL 2 MG/2ML IJ SOLN
INTRAMUSCULAR | Status: DC | PRN
  Administered 2019-07-01: 2 mg via INTRAVENOUS

## 2019-07-01 MED ORDER — ALLOPURINOL 300 MG PO TABS
300.0000 mg | ORAL_TABLET | Freq: Every day | ORAL | Status: DC
  Administered 2019-07-01 – 2019-07-02 (×2): 300 mg via ORAL
  Filled 2019-07-01 (×2): qty 1

## 2019-07-01 MED ORDER — ONDANSETRON HCL 4 MG/2ML IJ SOLN: 4.0000 mg | Freq: Four times a day (QID) | INTRAMUSCULAR | Status: DC | PRN

## 2019-07-01 MED ORDER — ATORVASTATIN CALCIUM 80 MG PO TABS: 80.0000 mg | ORAL_TABLET | Freq: Every day | ORAL | Status: DC

## 2019-07-01 MED ORDER — SODIUM CHLORIDE 0.9% FLUSH: 3.0000 mL | INTRAVENOUS | Status: DC | PRN

## 2019-07-01 MED ORDER — SODIUM CHLORIDE 0.9% FLUSH
3.0000 mL | Freq: Once | INTRAVENOUS | Status: AC
  Administered 2019-07-01: 02:00:00 3 mL via INTRAVENOUS

## 2019-07-01 MED ORDER — HEPARIN BOLUS VIA INFUSION: 60.0000 [IU]/kg | Freq: Once | INTRAVENOUS | Status: DC

## 2019-07-01 MED ORDER — METOPROLOL TARTRATE 12.5 MG HALF TABLET
12.5000 mg | ORAL_TABLET | Freq: Two times a day (BID) | ORAL | Status: DC
  Administered 2019-07-01 – 2019-07-02 (×2): 12.5 mg via ORAL
  Filled 2019-07-01 (×2): qty 1

## 2019-07-01 MED ORDER — FENTANYL CITRATE (PF) 100 MCG/2ML IJ SOLN
INTRAMUSCULAR | Status: AC
  Filled 2019-07-01: qty 2

## 2019-07-01 MED ORDER — ASPIRIN 325 MG PO TABS
ORAL_TABLET | ORAL | Status: AC
  Filled 2019-07-01: qty 1

## 2019-07-01 MED ORDER — BUPROPION HCL ER (XL) 150 MG PO TB24
300.0000 mg | ORAL_TABLET | Freq: Every day | ORAL | Status: DC
  Administered 2019-07-01 – 2019-07-02 (×2): 300 mg via ORAL
  Filled 2019-07-01 (×2): qty 2

## 2019-07-01 MED ORDER — ONDANSETRON HCL 4 MG/2ML IJ SOLN
INTRAMUSCULAR | Status: AC
  Filled 2019-07-01: qty 2

## 2019-07-01 MED ORDER — ACETAMINOPHEN 325 MG PO TABS: 650.0000 mg | ORAL_TABLET | ORAL | Status: DC | PRN

## 2019-07-01 MED ORDER — HEPARIN (PORCINE) 25000 UT/250ML-% IV SOLN
1450.0000 [IU]/h | INTRAVENOUS | Status: DC
  Administered 2019-07-01: 03:00:00 1450 [IU]/h via INTRAVENOUS
  Filled 2019-07-01: qty 250

## 2019-07-01 MED ORDER — DIAZEPAM 5 MG PO TABS: 5.0000 mg | ORAL_TABLET | Freq: Four times a day (QID) | ORAL | Status: DC | PRN

## 2019-07-01 MED ORDER — AMLODIPINE BESYLATE 5 MG PO TABS: 2.5000 mg | ORAL_TABLET | Freq: Every day | ORAL | Status: DC

## 2019-07-01 MED ORDER — TICAGRELOR 90 MG PO TABS
90.0000 mg | ORAL_TABLET | Freq: Two times a day (BID) | ORAL | Status: DC
  Administered 2019-07-01 – 2019-07-02 (×2): 90 mg via ORAL
  Filled 2019-07-01 (×2): qty 1

## 2019-07-01 MED ORDER — HEPARIN SODIUM (PORCINE) 1000 UNIT/ML IJ SOLN
INTRAMUSCULAR | Status: DC | PRN
  Administered 2019-07-01 (×2): 6000 [IU] via INTRAVENOUS
  Administered 2019-07-01: 2000 [IU] via INTRAVENOUS

## 2019-07-01 MED ORDER — LIDOCAINE HCL (PF) 1 % IJ SOLN
INTRAMUSCULAR | Status: DC | PRN
  Administered 2019-07-01: 3 mL

## 2019-07-01 MED ORDER — INSULIN PUMP
Freq: Three times a day (TID) | SUBCUTANEOUS | Status: DC
  Administered 2019-07-01: 2 via SUBCUTANEOUS
  Filled 2019-07-01: qty 1

## 2019-07-01 MED ORDER — FENTANYL CITRATE (PF) 100 MCG/2ML IJ SOLN
INTRAMUSCULAR | Status: AC
  Administered 2019-07-01: 100 ug via INTRAVENOUS
  Filled 2019-07-01: qty 2

## 2019-07-01 MED ORDER — EZETIMIBE 10 MG PO TABS
10.0000 mg | ORAL_TABLET | Freq: Every day | ORAL | Status: DC
  Administered 2019-07-01 – 2019-07-02 (×2): 10 mg via ORAL
  Filled 2019-07-01 (×3): qty 1

## 2019-07-01 MED ORDER — METOPROLOL SUCCINATE ER 25 MG PO TB24: 100.0000 mg | ORAL_TABLET | Freq: Every day | ORAL | Status: DC

## 2019-07-01 MED ORDER — NITROGLYCERIN 1 MG/10 ML FOR IR/CATH LAB
INTRA_ARTERIAL | Status: DC | PRN
  Administered 2019-07-01 (×3): 200 ug via INTRACORONARY

## 2019-07-01 MED ORDER — ATORVASTATIN CALCIUM 80 MG PO TABS
80.0000 mg | ORAL_TABLET | Freq: Every day | ORAL | Status: DC
  Administered 2019-07-01 – 2019-07-02 (×2): 80 mg via ORAL
  Filled 2019-07-01 (×2): qty 1

## 2019-07-01 MED ORDER — SODIUM CHLORIDE 0.9 % WEIGHT BASED INFUSION
3.0000 mL/kg/h | INTRAVENOUS | Status: DC
  Administered 2019-07-01: 3 mL/kg/h via INTRAVENOUS

## 2019-07-01 MED ORDER — LABETALOL HCL 5 MG/ML IV SOLN: 10.0000 mg | INTRAVENOUS | Status: AC | PRN

## 2019-07-01 MED ORDER — NITROGLYCERIN 1 MG/10 ML FOR IR/CATH LAB
INTRA_ARTERIAL | Status: AC
  Filled 2019-07-01: qty 10

## 2019-07-01 MED ORDER — ASPIRIN EC 81 MG PO TBEC: 81.0000 mg | DELAYED_RELEASE_TABLET | Freq: Every day | ORAL | Status: DC

## 2019-07-01 MED ORDER — ENOXAPARIN SODIUM 40 MG/0.4ML ~~LOC~~ SOLN
40.0000 mg | SUBCUTANEOUS | Status: DC
  Administered 2019-07-02: 40 mg via SUBCUTANEOUS
  Filled 2019-07-01: qty 0.4

## 2019-07-01 MED ORDER — SODIUM CHLORIDE 0.9 % WEIGHT BASED INFUSION
1.0000 mL/kg/h | INTRAVENOUS | Status: DC
  Administered 2019-07-01: 150 mL/h via INTRAVENOUS

## 2019-07-01 MED ORDER — MIDAZOLAM HCL 2 MG/2ML IJ SOLN
INTRAMUSCULAR | Status: AC
  Filled 2019-07-01: qty 2

## 2019-07-01 MED ORDER — NITROGLYCERIN IN D5W 200-5 MCG/ML-% IV SOLN
2.0000 ug/min | INTRAVENOUS | Status: DC
  Administered 2019-07-01: 5 ug/min via INTRAVENOUS
  Filled 2019-07-01: qty 250

## 2019-07-01 MED ORDER — SODIUM CHLORIDE 0.9 % IV SOLN: 250.0000 mL | INTRAVENOUS | Status: DC | PRN

## 2019-07-01 MED ORDER — LOSARTAN POTASSIUM-HCTZ 100-12.5 MG PO TABS: 1.0000 | ORAL_TABLET | Freq: Every day | ORAL | Status: DC

## 2019-07-01 MED ORDER — METOPROLOL TARTRATE 25 MG PO TABS
12.5000 mg | ORAL_TABLET | Freq: Two times a day (BID) | ORAL | Status: DC
  Administered 2019-07-01: 12.5 mg via ORAL
  Filled 2019-07-01: qty 1

## 2019-07-01 MED ORDER — IOHEXOL 350 MG/ML SOLN
INTRAVENOUS | Status: AC
  Filled 2019-07-01: qty 1

## 2019-07-01 MED ORDER — FENTANYL CITRATE (PF) 100 MCG/2ML IJ SOLN
INTRAMUSCULAR | Status: DC | PRN
  Administered 2019-07-01: 50 ug via INTRAVENOUS

## 2019-07-01 MED ORDER — DULOXETINE HCL 60 MG PO CPEP
120.0000 mg | ORAL_CAPSULE | Freq: Every day | ORAL | Status: DC
  Administered 2019-07-01 – 2019-07-02 (×2): 120 mg via ORAL
  Filled 2019-07-01 (×2): qty 2

## 2019-07-01 MED ORDER — IOHEXOL 350 MG/ML SOLN
INTRAVENOUS | Status: DC | PRN
  Administered 2019-07-01: 125 mL

## 2019-07-01 MED ORDER — ASPIRIN 81 MG PO CHEW
81.0000 mg | CHEWABLE_TABLET | Freq: Every day | ORAL | Status: DC
  Administered 2019-07-02: 81 mg via ORAL
  Filled 2019-07-01: qty 1

## 2019-07-01 MED ORDER — VERAPAMIL HCL 2.5 MG/ML IV SOLN
INTRAVENOUS | Status: AC
  Filled 2019-07-01: qty 2

## 2019-07-01 SURGICAL SUPPLY — 19 items
BALLN SAPPHIRE 2.0X15 (BALLOONS) ×2
BALLN ~~LOC~~ EMERGE MR 3.25X20 (BALLOONS) ×2
BALLOON SAPPHIRE 2.0X15 (BALLOONS) IMPLANT
BALLOON ~~LOC~~ EMERGE MR 3.25X20 (BALLOONS) IMPLANT
CATH INFINITI JR4 5F (CATHETERS) ×1 IMPLANT
CATH OPTITORQUE TIG 4.0 5F (CATHETERS) ×1 IMPLANT
CATH VISTA GUIDE 6FR XBLAD3.5 (CATHETERS) ×1 IMPLANT
DEVICE RAD COMP TR BAND LRG (VASCULAR PRODUCTS) ×1 IMPLANT
GLIDESHEATH SLEND SS 6F .021 (SHEATH) ×1 IMPLANT
GUIDEWIRE INQWIRE 1.5J.035X260 (WIRE) IMPLANT
INQWIRE 1.5J .035X260CM (WIRE) ×2
KIT ENCORE 26 ADVANTAGE (KITS) ×1 IMPLANT
KIT HEART LEFT (KITS) ×2 IMPLANT
PACK CARDIAC CATHETERIZATION (CUSTOM PROCEDURE TRAY) ×2 IMPLANT
STENT RESOLUTE ONYX 3.0X26 (Permanent Stent) ×1 IMPLANT
SYR MEDRAD MARK 7 150ML (SYRINGE) ×2 IMPLANT
TRANSDUCER W/STOPCOCK (MISCELLANEOUS) ×2 IMPLANT
TUBING CIL FLEX 10 FLL-RA (TUBING) ×2 IMPLANT
WIRE PT2 MS 185 (WIRE) ×1 IMPLANT

## 2019-07-01 NOTE — Progress Notes (Signed)
ANTICOAGULATION CONSULT NOTE - Initial Consult  Pharmacy Consult:  Heparin Indication: chest pain/ACS  No Known Allergies  Patient Measurements: Height: 6' (182.9 cm) Weight: 120.2 kg (265 lb) IBW/kg (Calculated) : 77.6 Heparin Dosing Weight: 104 kg  Vital Signs: Temp: 97.8 F (36.6 C) (04/15 0156) Temp Source: Oral (04/15 0156) BP: 127/83 (04/15 1030) Pulse Rate: 67 (04/15 1030)  Labs: Recent Labs    07/01/19 0229 07/01/19 0343 07/01/19 1014  HGB 15.3 14.0  --   HCT 45.0 42.2  --   PLT  --  200  --   HEPARINUNFRC  --   --  0.52  CREATININE 1.40* 1.22  --   TROPONINIHS  --  >27,000*  --     Estimated Creatinine Clearance: 84 mL/min (by C-G formula based on SCr of 1.22 mg/dL).   Medical History: Past Medical History:  Diagnosis Date  . Chronic rhinitis   . GERD (gastroesophageal reflux disease)   . History of skin cancer   . Hypertension   . Low libido   . OCD (obsessive compulsive disorder)   . OSA (obstructive sleep apnea)   . Seasonal allergies     Assessment: 44 YOM presented with chest pain and Pharmacy consulted to dose IV heparin for ACS.  Code STEMI called and then canceled.  Baseline labs have been collected.  HL 0.52  Goal of Therapy:  Heparin level 0.3-0.7 units/ml Monitor platelets by anticoagulation protocol: Yes   Plan:  Continue Heparin gtt at 1450 units/hr F/u post-cath  Jeanella Cara, PharmD, [facility] Clinical Pharmacist Please see AMION for all Pharmacists' Contact Phone Numbers 07/01/2019, 11:32 AM

## 2019-07-01 NOTE — H&P (Addendum)
Cardiology History & Physical    Patient ID: Gabriel Bean MRN: [id number]; DOB: February 14, 1956   Admission date: 07/01/2019  Primary Care Provider: Merlene Laughter, MD Primary Cardiologist: No primary care provider on file.  Primary Electrophysiologist:  None   Chief Complaint:  Chest pain  Patient Profile:   Gabriel Bean is a 64 y.o. male with diabetes, HTN, tobacco use, and no cardiac history who presents with chest pain.  History of Present Illness:   Gabriel Bean had acute onset of chest pain at 2pm.  Describes it as midsternal and constant.  He eventually called EMS because the pain wasn't going away.  Initial 12 lead showed sinus rhythm with 1-59mm STE V2-V4, with Q waves in V1-V3.  On arrival to the ED the ST elevation resolved.  He continues to have 4/10 pain and appears diaphoretic.  He denies ever having a heart catheterization.  He continues to smoke.  Heart Pathway Score:      Past Medical History:  Diagnosis Date  . Chronic rhinitis   . GERD (gastroesophageal reflux disease)   . History of skin cancer   . Hypertension   . Low libido   . OCD (obsessive compulsive disorder)   . OSA (obstructive sleep apnea)   . Seasonal allergies     Past Surgical History:  Procedure Laterality Date  . APPENDECTOMY    . COLONOSCOPY     x3  . SKIN CANCER EXCISION    . TONSILLECTOMY    . WISDOM TOOTH EXTRACTION       Medications Prior to Admission: Prior to Admission medications   Medication Sig Start Date End Date Taking? Authorizing Provider  allopurinol (ZYLOPRIM) 300 MG tablet Take 300 mg by mouth daily. 11/10/15   [provider]  buPROPion (WELLBUTRIN XL) 300 MG 24 hr tablet Take 1 tablet by mouth daily.    [provider]  DULoxetine (CYMBALTA) 60 MG capsule Take 120 mg by mouth daily. 11/10/15   [provider]  JANUVIA 100 MG tablet Take 100 mg by mouth daily. 07/19/16   [provider]  LANTUS SOLOSTAR 100 UNIT/ML Solostar Pen  Inject 10 Units as directed at bedtime. 07/26/16   [provider]  metFORMIN (GLUCOPHAGE) 1000 MG tablet Take 1,000 mg by mouth 2 (two) times daily. 11/10/15   [provider]  metoprolol succinate (TOPROL-XL) 25 MG 24 hr tablet Take 25 mg by mouth daily. 11/10/15   [provider]  NASONEX 50 MCG/ACT nasal spray Place 2 sprays into the nose daily.    [provider]  OVER THE COUNTER MEDICATION Take 20 mg by mouth daily. [facility] brand acid reducer    [provider]  rosuvastatin (CRESTOR) 5 MG tablet Take 1 tablet (5 mg total) by mouth daily. 09/17/16 12/16/16  Camnitz, Andree Coss, MD  sildenafil (REVATIO) 20 MG tablet Take 20 mg by mouth daily as needed.    [provider]     Allergies:   No Known Allergies  Social History:   Social History   Socioeconomic History  . Marital status: Married    Spouse name: Not on file  . Number of children: 0  . Years of education: Not on file  . Highest education level: Not on file  Occupational History  . Occupation: Animal nutritionist: [employer]  Tobacco Use  . Smoking status: Current Every Day Smoker    Packs/day: 1.00    Types: Cigarettes  .  Smokeless tobacco: Current User    Types: Snuff  . Tobacco comment: started smoking at age 8.    Substance and Sexual Activity  . Alcohol use: Yes    Comment: 5 drinks per day  . Drug use: No  . Sexual activity: Not on file  Other Topics Concern  . Not on file  Social History Narrative  . Not on file   Social Determinants of Health   Financial Resource Strain:   . Difficulty of Paying Living Expenses:   Food Insecurity:   . Worried About Programme researcher, broadcasting/film/video in the Last Year:   . Barista in the Last Year:   Transportation Needs:   . Freight forwarder (Medical):   Marland Kitchen Lack of Transportation (Non-Medical):   Physical Activity:   . Days of Exercise per Week:   . Minutes of Exercise per Session:   Stress:   .  Feeling of Stress :   Social Connections:   . Frequency of Communication with Friends and Family:   . Frequency of Social Gatherings with Friends and Family:   . Attends Religious Services:   . Active Member of Clubs or Organizations:   . Attends Banker Meetings:   Marland Kitchen Marital Status:   Intimate Partner Violence:   . Fear of Current or Ex-Partner:   . Emotionally Abused:   Marland Kitchen Physically Abused:   . Sexually Abused:      Family History:   The patient's family history includes Cancer in his father; Colon cancer in his father; Heart disease in his brother and father; Other in his mother; Skin cancer in his father.    ROS:  Please see the history of present illness.  All other ROS reviewed and negative.     Physical Exam/Data:   Vitals:   07/01/19 0156 07/01/19 0200  BP: (!) 146/94   Pulse: 75   Resp: 16   Temp: 97.8 F (36.6 C)   TempSrc: Oral   SpO2: 99%   Weight:  120.2 kg  Height:  6' (1.829 m)   No intake or output data in the 24 hours ending 07/01/19 0235 Last 3 Weights 07/01/2019 09/17/2016 08/05/2016  Weight (lbs) 265 lb 251 lb 12.8 oz 248 lb 12.8 oz  Weight (kg) 120.203 kg 114.216 kg 112.855 kg     Body mass index is 35.94 kg/m.  Wt Readings from Last 3 Encounters:  07/01/19 120.2 kg  09/17/16 114.2 kg  08/05/16 112.9 kg    Physical Exam: General: Well developed, well nourished, in no acute distress. Head: Normocephalic, atraumatic, sclera non-icteric, no xanthomas, nares are without discharge.  Neck: Negative for carotid bruits. JVD not elevated. Lungs: Clear bilaterally to auscultation without wheezes, rales, or rhonchi. Breathing is unlabored. Heart: RRR with S1 S2. No murmurs, rubs, or gallops appreciated. Abdomen: Soft, non-tender, non-distended with normoactive bowel sounds. No hepatomegaly. No rebound/guarding. No obvious abdominal masses. Msk:  Strength and tone appear normal for age. Extremities: No clubbing or cyanosis. No edema.  Distal  pedal pulses are 2+ and equal bilaterally. Neuro: Alert and oriented X 3. No focal deficit. No facial asymmetry. Moves all extremities spontaneously. Psych:  Responds to questions appropriately with a normal affect.    EKG:  The ECG that was done sinus rhythm, ST elevation in V1-V3  Relevant CV Studies: None  Laboratory Data:  High Sensitivity Troponin:  No results for input(s): TROPONINIHS in the last 720 hours.    Cardiac EnzymesNo results for  input(s): TROPONINI in the last 168 hours. No results for input(s): TROPIPOC in the last 168 hours.  Chemistry Recent Labs  Lab 07/01/19 0229  NA 137  K 4.6  CL 102  GLUCOSE 202*  BUN 19  CREATININE 1.40*    No results for input(s): PROT, ALBUMIN, AST, ALT, ALKPHOS, BILITOT in the last 168 hours. Hematology Recent Labs  Lab 07/01/19 0229  HGB 15.3  HCT 45.0   BNPNo results for input(s): BNP, PROBNP in the last 168 hours.  DDimer No results for input(s): DDIMER in the last 168 hours.   Radiology/Studies:  DG Chest Portable 1 View  Result Date: 07/01/2019 CLINICAL DATA:  Right-sided chest pain EXAM: PORTABLE CHEST 1 VIEW COMPARISON:  11/09/2015, 06/29/2019 FINDINGS: 2 frontal views of the chest demonstrate the peripheral ground-glass left upper lobe airspace disease seen on recent chest CT. No appreciable change. No effusion or pneumothorax. Cardiac silhouette is unremarkable. No acute bony abnormalities. IMPRESSION: 1. Continued left upper lobe ground-glass airspace disease which may reflect pneumonia. Previous CT examination recommended 3 month follow-up study to evaluate resolution. Electronically Signed   By: Randa Ngo M.D.   On: 07/01/2019 02:15    Assessment and Plan  1. Unstable angina with borderline ECG Acute onset mid sternal chest pain with ECG that initially was borderline for STEMI, and now with resolution of the ST elevation.  Will plan attempt at medical management with nitroglycerin and opioids for pain control.   If symptoms cannot be controlled then will consider urgent cath overnight. Case discussed with Dr. Ellyn Hack. -- IV heparin -- IV nitroglycerin -- asa 81 daily -- start metoprolol -- echo ordered -- NPO for cath in the morning  2. Diabetes Will need orders for insulin pump  3. HTN Hold home antihypertensives while on Nitroglycerin  4. Mood disorder Continue wellbutrin and cymbalta   Severity of Illness: The appropriate patient status for this patient is INPATIENT. Inpatient status is judged to be reasonable and necessary in order to provide the required intensity of service to ensure the patient's safety. The patient's presenting symptoms, physical exam findings, and initial radiographic and laboratory data in the context of their chronic comorbidities is felt to place them at high risk for further clinical deterioration. Furthermore, it is not anticipated that the patient will be medically stable for discharge from the hospital within 2 midnights of admission. The following factors support the patient status of inpatient.   " The patient's presenting symptoms include chest pain. " The worrisome physical exam findings include abnormal ECG. " The initial radiographic and laboratory data are worrisome because of abnormal ECG. " The chronic co-morbidities include DM, HTN.   * I certify that at the point of admission it is my clinical judgment that the patient will require inpatient hospital care spanning beyond 2 midnights from the point of admission due to high intensity of service, high risk for further deterioration and high frequency of surveillance required.*    For questions or updates, please contact [facility] Please consult [email or web address] for contact info under      Signed, Keilin Gamboa S, MD 07/01/2019, 2:35 AM

## 2019-07-01 NOTE — Progress Notes (Signed)
  Echocardiogram 2D Echocardiogram has been performed.  Gabriel Bean 07/01/2019, 11:34 AM

## 2019-07-01 NOTE — H&P (Signed)
Cardiology History & Physical    Patient ID: Gabriel THIVIERGE MRN: [id number]; DOB: 31-Aug-1955   Admission date: 07/01/2019  Primary Care Provider: Merlene Laughter, MD Primary Cardiologist: New--Letanya Froh Primary Electrophysiologist:  None   Chief Complaint:  Chest pain  Patient Profile:   Gabriel Bean is a 64 y.o. male with diabetes, HTN, tobacco use, and no cardiac history who presents with chest pain.  History of Present Illness:   Gabriel Bean is a pleasant gentleman without prior known heart history, but PMH of type II diabetes, hypertension, tobacco use who presented with chest pain. He never had chest pain until about 3 days ago, when he had a pressure like sensation in the center of his chest that went away. Yesterday afternoon he was standing up, cooking in the kitchen, and he developed 7/10 central chest pain/pressure that radiated to both shoulders and down both arms. He was mildly short of breath and very clammy. He presented to the ER for this.  Initial ECG here with borderline ST elevations in V1-V3. Multiple ECGs were done overnight, and the ST changes appeared to improve. However, this morning's ECG with concerning ST segments in V2-V3. He tells me he has had continued chest pain overnight, though improved to 3/10. His hsTn >27,000.  His father had history of HTN and brother had a congenital heart disease.   Past Medical History:  Diagnosis Date  . Chronic rhinitis   . GERD (gastroesophageal reflux disease)   . History of skin cancer   . Hypertension   . Low libido   . OCD (obsessive compulsive disorder)   . OSA (obstructive sleep apnea)   . Seasonal allergies     Past Surgical History:  Procedure Laterality Date  . APPENDECTOMY    . COLONOSCOPY     x3  . SKIN CANCER EXCISION    . TONSILLECTOMY    . WISDOM TOOTH EXTRACTION       Medications Prior to Admission: Prior to Admission medications    Current Meds  Medication Sig  . allopurinol (ZYLOPRIM)  300 MG tablet Take 300 mg by mouth daily.  Marland Kitchen amLODipine (NORVASC) 2.5 MG tablet Take 2.5 mg by mouth at bedtime.   Marland Kitchen buPROPion (WELLBUTRIN XL) 300 MG 24 hr tablet Take 300 mg by mouth daily.   . DULoxetine (CYMBALTA) 60 MG capsule Take 120 mg by mouth daily.  Marland Kitchen ezetimibe (ZETIA) 10 MG tablet Take 10 mg by mouth daily.  . Insulin Human (INSULIN PUMP) SOLN Inject into the skin continuous. novolog  . JANUVIA 100 MG tablet Take 100 mg by mouth daily.  Marland Kitchen losartan-hydrochlorothiazide (HYZAAR) 100-12.5 MG tablet Take 1 tablet by mouth daily.  . metoprolol succinate (TOPROL-XL) 100 MG 24 hr tablet Take 100 mg by mouth daily.  . rosuvastatin (CRESTOR) 5 MG tablet Take 1 tablet (5 mg total) by mouth daily. (Patient taking differently: Take 5 mg by mouth every Sunday. )    Allergies:   No Known Allergies  Social History:   Social History   Socioeconomic History  . Marital status: Married    Spouse name: Not on file  . Number of children: 0  . Years of education: Not on file  . Highest education level: Not on file  Occupational History  . Occupation: Animal nutritionist: [employer]  Tobacco Use  . Smoking status: Current Every Day Smoker    Packs/day: 1.00    Types: Cigarettes  . Smokeless tobacco: Current  User    Types: Snuff  . Tobacco comment: started smoking at age 61.    Substance and Sexual Activity  . Alcohol use: Yes    Comment: 5 drinks per day  . Drug use: No  . Sexual activity: Not on file  Other Topics Concern  . Not on file  Social History Narrative  . Not on file   Social Determinants of Health   Financial Resource Strain:   . Difficulty of Paying Living Expenses:   Food Insecurity:   . Worried About Charity fundraiser in the Last Year:   . Arboriculturist in the Last Year:   Transportation Needs:   . Film/video editor (Medical):   Marland Kitchen Lack of Transportation (Non-Medical):   Physical Activity:   . Days of Exercise per Week:   . Minutes of  Exercise per Session:   Stress:   . Feeling of Stress :   Social Connections:   . Frequency of Communication with Friends and Family:   . Frequency of Social Gatherings with Friends and Family:   . Attends Religious Services:   . Active Member of Clubs or Organizations:   . Attends Archivist Meetings:   Marland Kitchen Marital Status:   Intimate Partner Violence:   . Fear of Current or Ex-Partner:   . Emotionally Abused:   Marland Kitchen Physically Abused:   . Sexually Abused:      Family History:   The patient's family history includes Cancer in his father; Colon cancer in his father; Heart disease in his brother and father; Other in his mother; Skin cancer in his father.    ROS:  Please see the history of present illness.  Constitutional: Negative for chills, fever, night sweats, unintentional weight loss  HENT: Negative for ear pain and hearing loss.   Eyes: Negative for loss of vision and eye pain.  Respiratory: Negative for cough, sputum, wheezing.   Cardiovascular: See HPI. Gastrointestinal: Negative for abdominal pain, melena, and hematochezia.  Genitourinary: Negative for dysuria and hematuria.  Musculoskeletal: Negative for falls and myalgias.  Skin: Negative for itching and rash.  Neurological: Negative for focal weakness, focal sensory changes and loss of consciousness.  Endo/Heme/Allergies: Does not bruise/bleed easily.  All other ROS reviewed and negative.     Physical Exam/Data:   Vitals:   07/01/19 0730 07/01/19 0745 07/01/19 0800 07/01/19 0815  BP:   128/84 122/82  Pulse: 67 67 69 65  Resp: 11 10 16 17   Temp:      TempSrc:      SpO2: 96% 95% 96% 95%  Weight:      Height:       No intake or output data in the 24 hours ending 07/01/19 0845 Last 3 Weights 07/01/2019 09/17/2016 08/05/2016  Weight (lbs) 265 lb 251 lb 12.8 oz 248 lb 12.8 oz  Weight (kg) 120.203 kg 114.216 kg 112.855 kg     Body mass index is 35.94 kg/m.  Wt Readings from Last 3 Encounters:  07/01/19  120.2 kg  09/17/16 114.2 kg  08/05/16 112.9 kg   GEN: Well nourished, well developed in no acute distress HEENT: Normal, moist mucous membranes NECK: No JVD CARDIAC: regular rhythm, normal S1 and S2, no rubs or gallops. No murmur. VASCULAR: Radial and DP pulses 2+ bilaterally. No carotid bruits RESPIRATORY:  Clear to auscultation without rales, wheezing or rhonchi  ABDOMEN: Soft, non-tender, non-distended MUSCULOSKELETAL:  Ambulates independently SKIN: Warm and dry, no edema NEUROLOGIC:  Alert  and oriented x 3. No focal neuro deficits noted. PSYCHIATRIC:  Normal affect   EKG: initial ECG with borderline ST elevation in V1-V3, improved overnight, now with borderline ST elevation V2-V3  Relevant CV Studies: None  Laboratory Data:  High Sensitivity Troponin:   Recent Labs  Lab 07/01/19 0343  TROPONINIHS >27,000*      Cardiac EnzymesNo results for input(s): TROPONINI in the last 168 hours. No results for input(s): TROPIPOC in the last 168 hours.  Chemistry Recent Labs  Lab 07/01/19 0229 07/01/19 0343  NA 137 135  K 4.6 4.2  CL 102 103  CO2  --  21*  GLUCOSE 202* 179*  BUN 19 14  CREATININE 1.40* 1.22  CALCIUM  --  8.8*  GFRNONAA  --  >60  GFRAA  --  >60  ANIONGAP  --  11    No results for input(s): PROT, ALBUMIN, AST, ALT, ALKPHOS, BILITOT in the last 168 hours. Hematology Recent Labs  Lab 07/01/19 0229 07/01/19 0343  WBC  --  12.8*  RBC  --  4.46  HGB 15.3 14.0  HCT 45.0 42.2  MCV  --  94.6  MCH  --  31.4  MCHC  --  33.2  RDW  --  13.7  PLT  --  200   BNPNo results for input(s): BNP, PROBNP in the last 168 hours.  DDimer No results for input(s): DDIMER in the last 168 hours.   Radiology/Studies:  DG Chest Portable 1 View  Result Date: 07/01/2019 CLINICAL DATA:  Right-sided chest pain EXAM: PORTABLE CHEST 1 VIEW COMPARISON:  11/09/2015, 06/29/2019 FINDINGS: 2 frontal views of the chest demonstrate the peripheral ground-glass left upper lobe airspace  disease seen on recent chest CT. No appreciable change. No effusion or pneumothorax. Cardiac silhouette is unremarkable. No acute bony abnormalities. IMPRESSION: 1. Continued left upper lobe ground-glass airspace disease which may reflect pneumonia. Previous CT examination recommended 3 month follow-up study to evaluate resolution. Electronically Signed   By: Sharlet Salina M.D.   On: 07/01/2019 02:15    Assessment and Plan  NSTEMI, borderline STEMI -classic symptoms at rest -risk factors of type II diabetes, tobacco use, obesity and hypertension -ECG as above, concerning for borderline STEMI -hsTN >27,000 -placed on heparin overnight -on nitro drip, but still having chest pain -on aspirin 81 mg -was on rosuvastatin 5 mg at home, started on atorvastatin 80 mg daily overnight -on ezetimbe 10 mg at home, continued -was on metoprolol succinate 100 mg daily at home, changed to 12.5 mg BID last night -we discussed cath today, and he is amenable Risks and benefits of cardiac catheterization have been discussed with the patient.  These include bleeding, infection, kidney damage, stroke, heart attack, death.  The patient understands these risks and is willing to proceed. -echo, cardiac rehab ordered -tobacco cessation  Hypertension: -holding losartan-HCTZ on nitro drip  Type II diabetes: -on januvia as an outpatient, with insulin pump -will ask for assistance with insulin and diabetes management from diabetes coordinator  Severity of Illness: The appropriate patient status for this patient is INPATIENT. Inpatient status is judged to be reasonable and necessary in order to provide the required intensity of service to ensure the patient's safety. The patient's presenting symptoms, physical exam findings, and initial radiographic and laboratory data in the context of their chronic comorbidities is felt to place them at high risk for further clinical deterioration. Furthermore, it is not anticipated  that the patient will be medically stable for discharge from  the hospital within 2 midnights of admission. The following factors support the patient status of inpatient.   " The patient's presenting symptoms include chest pain. " The worrisome physical exam findings include abnormal ECG. " The initial radiographic and laboratory data are worrisome because of abnormal ECG. " The chronic co-morbidities include DM, HTN.   * I certify that at the point of admission it is my clinical judgment that the patient will require inpatient hospital care spanning beyond 2 midnights from the point of admission due to high intensity of service, high risk for further deterioration and high frequency of surveillance required.*    For questions or updates, please contact [facility] Please consult [email or web address] for contact info under    Signed, Jodelle Red, MD 07/01/2019, 8:45 AM

## 2019-07-01 NOTE — Progress Notes (Signed)
ANTICOAGULATION CONSULT NOTE - Initial Consult  Pharmacy Consult:  Heparin Indication: chest pain/ACS  No Known Allergies  Patient Measurements: Height: 6' (182.9 cm) Weight: 120.2 kg (265 lb) IBW/kg (Calculated) : 77.6 Heparin Dosing Weight: 104 kg  Vital Signs: Temp: 97.8 F (36.6 C) (04/15 0156) Temp Source: Oral (04/15 0156) BP: 146/94 (04/15 0156) Pulse Rate: 75 (04/15 0156)  Labs: Recent Labs    07/01/19 0229  HGB 15.3  HCT 45.0  CREATININE 1.40*    Estimated Creatinine Clearance: 73.2 mL/min (A) (by C-G formula based on SCr of 1.4 mg/dL (H)).   Medical History: Past Medical History:  Diagnosis Date  . Chronic rhinitis   . GERD (gastroesophageal reflux disease)   . History of skin cancer   . Hypertension   . Low libido   . OCD (obsessive compulsive disorder)   . OSA (obstructive sleep apnea)   . Seasonal allergies     Assessment: 46 YOM presented with chest pain and Pharmacy consulted to dose IV heparin for ACS.  Code STEMI called and then canceled.  Baseline labs have been collected.  Goal of Therapy:  Heparin level 0.3-0.7 units/ml Monitor platelets by anticoagulation protocol: Yes   Plan:  Heparin bolus already administered Heparin gtt at 1450 units/hr Check 6 hr heparin level Daily heparin level and CBC  Rai Sinagra D. Laney Potash, PharmD, BCPS, BCCCP 07/01/2019, 2:36 AM

## 2019-07-01 NOTE — ED Provider Notes (Signed)
[facility] EMERGENCY DEPARTMENT Provider Note   CSN: [id number] Arrival date & time: 07/01/19  0144     History Chief Complaint  Patient presents with  . Code STEMI    Gabriel Bean is a 64 y.o. male with history of GERD, diabetes, hypertension, OSA, allergies, tobacco abuse presenting for evaluation of acute onset, constant substernal chest pains beginning at 2 PM today while he was cooking lunch.  The pain is dull, burning, pressure-like.  It radiates to the bilateral shoulders and into the upper extremities.  He tells me that he thinks he has diabetic neuropathy with chronic paresthesias in the upper and lower extremities but states that he feels as though his upper extremities are a little bit more numb than usual. Nothing makes the pain better or worse.  He notes associated diaphoresis.  He denies nausea, vomiting, abdominal pain.  He does note feeling lightheaded but denies syncope or shortness of breath.  He took Pepto-Bismol without relief of symptoms.  EMS administered 324 mg of aspirin with little relief, but noted the patient had a 60 point drop in systolic blood pressure so they did not give nitroglycerin at that time.  He is a current smoker of approximately 1 pack of cigarettes daily, denies recreational drug use.  He tells me that he typically drinks 10 beers and a double of bourbon daily.  He has been seen by Dr. Elberta Fortis previously, last seen in the office on 09/17/2016.  The history is provided by the patient and medical records.       Past Medical History:  Diagnosis Date  . Chronic rhinitis   . GERD (gastroesophageal reflux disease)   . History of skin cancer   . Hypertension   . Low libido   . OCD (obsessive compulsive disorder)   . OSA (obstructive sleep apnea)   . Seasonal allergies     Patient Active Problem List   Diagnosis Date Noted  . Chest pain 07/01/2019  . Chronic bronchitis (HCC) 08/17/2011  . Tussive syncope 07/03/2011  .  Rhinitis 07/03/2011  . Tobacco user 07/03/2011    Past Surgical History:  Procedure Laterality Date  . APPENDECTOMY    . COLONOSCOPY     x3  . SKIN CANCER EXCISION    . TONSILLECTOMY    . WISDOM TOOTH EXTRACTION         Family History  Problem Relation Age of Onset  . Other Mother        allergies  . Heart disease Father   . Colon cancer Father   . Skin cancer Father   . Cancer Father        kidney  . Heart disease Brother     Social History   Tobacco Use  . Smoking status: Current Every Day Smoker    Packs/day: 1.00    Types: Cigarettes  . Smokeless tobacco: Current User    Types: Snuff  . Tobacco comment: started smoking at age 52.    Substance Use Topics  . Alcohol use: Yes    Comment: 5 drinks per day  . Drug use: No    Home Medications Prior to Admission medications   Medication Sig Start Date End Date Taking? Authorizing Provider  allopurinol (ZYLOPRIM) 300 MG tablet Take 300 mg by mouth daily. 11/10/15  Yes [provider]  amLODipine (NORVASC) 2.5 MG tablet Take 2.5 mg by mouth at bedtime.  06/01/19  Yes [provider]  buPROPion (WELLBUTRIN XL) 300 MG  24 hr tablet Take 300 mg by mouth daily.    Yes [provider]  DULoxetine (CYMBALTA) 60 MG capsule Take 120 mg by mouth daily. 11/10/15  Yes [provider]  ezetimibe (ZETIA) 10 MG tablet Take 10 mg by mouth daily. 04/15/19  Yes [provider]  Insulin Human (INSULIN PUMP) SOLN Inject into the skin continuous. novolog   Yes [provider]  JANUVIA 100 MG tablet Take 100 mg by mouth daily. 07/19/16  Yes [provider]  losartan-hydrochlorothiazide (HYZAAR) 100-12.5 MG tablet Take 1 tablet by mouth daily. 05/18/19  Yes [provider]  metoprolol succinate (TOPROL-XL) 100 MG 24 hr tablet Take 100 mg by mouth daily. 04/16/19  Yes [provider]  rosuvastatin (CRESTOR) 5 MG tablet Take 1 tablet (5 mg total) by mouth daily. Patient  taking differently: Take 5 mg by mouth every Sunday.  09/17/16 06/30/28 Yes Camnitz, Andree Coss, MD    Allergies    Patient has no known allergies.  Review of Systems   Review of Systems  Constitutional: Positive for diaphoresis.  Respiratory: Negative for shortness of breath.   Cardiovascular: Positive for chest pain.  Gastrointestinal: Negative for abdominal pain, nausea and vomiting.  Neurological: Positive for light-headedness. Negative for syncope.  All other systems reviewed and are negative.   Physical Exam Updated Vital Signs BP 123/83   Pulse 66   Temp 97.8 F (36.6 C) (Oral)   Resp 15   Ht 6' (1.829 m)   Wt 120.2 kg   SpO2 95%   BMI 35.94 kg/m   Physical Exam Vitals and nursing note reviewed.  Constitutional:      General: He is not in acute distress.    Appearance: He is well-developed. He is diaphoretic.  HENT:     Head: Normocephalic and atraumatic.  Eyes:     General:        Right eye: No discharge.        Left eye: No discharge.     Conjunctiva/sclera: Conjunctivae normal.  Neck:     Vascular: No JVD.     Trachea: No tracheal deviation.  Cardiovascular:     Rate and Rhythm: Normal rate and regular rhythm.     Pulses: Normal pulses.     Comments: Trace pitting edema of the bilateral lower extremities, Homans' sign absent bilaterally.  2+ radial and DP/PT pulses bilaterally Pulmonary:     Effort: Pulmonary effort is normal.     Breath sounds: Rhonchi present.     Comments: Scattered rhonchi Abdominal:     General: Bowel sounds are normal. There is no distension.     Palpations: Abdomen is soft.  Musculoskeletal:     Cervical back: Neck supple.  Skin:    General: Skin is warm.     Findings: No erythema.  Neurological:     Mental Status: He is alert.  Psychiatric:        Behavior: Behavior normal.     ED Results / Procedures / Treatments   Labs (all labs ordered are listed, but only abnormal results are displayed) Labs Reviewed  BASIC  METABOLIC PANEL - Abnormal; Notable for the following components:      Result Value   CO2 21 (*)    Glucose, Bld 179 (*)    Calcium 8.8 (*)    All other components within normal limits  CBC - Abnormal; Notable for the following components:   WBC 12.8 (*)    All other components within  normal limits  TSH - Abnormal; Notable for the following components:   TSH 4.530 (*)    All other components within normal limits  HEMOGLOBIN A1C - Abnormal; Notable for the following components:   Hgb A1c MFr Bld 7.0 (*)    All other components within normal limits  I-STAT CHEM 8, ED - Abnormal; Notable for the following components:   Creatinine, Ser 1.40 (*)    Glucose, Bld 202 (*)    Calcium, Ion 1.13 (*)    All other components within normal limits  TROPONIN I (HIGH SENSITIVITY) - Abnormal; Notable for the following components:   Troponin I (High Sensitivity) >27,000 (*)    All other components within normal limits  RESPIRATORY PANEL BY RT PCR (FLU A&B, COVID)  LIPID PANEL  HEPARIN LEVEL (UNFRACTIONATED)    EKG EKG Interpretation  Date/Time:  Thursday July 01 2019 05:59:23 EDT Ventricular Rate:  69 PR Interval:    QRS Duration: 95 QT Interval:  413 QTC Calculation: 443 R Axis:   42 Text Interpretation: Sinus rhythm Anteroseptal infarct, acute Confirmed by Randal Buba, April ([id number]) on 07/01/2019 6:10:34 AM   Radiology DG Chest Portable 1 View  Result Date: 07/01/2019 CLINICAL DATA:  Right-sided chest pain EXAM: PORTABLE CHEST 1 VIEW COMPARISON:  11/09/2015, 06/29/2019 FINDINGS: 2 frontal views of the chest demonstrate the peripheral ground-glass left upper lobe airspace disease seen on recent chest CT. No appreciable change. No effusion or pneumothorax. Cardiac silhouette is unremarkable. No acute bony abnormalities. IMPRESSION: 1. Continued left upper lobe ground-glass airspace disease which may reflect pneumonia. Previous CT examination recommended 3 month follow-up study to evaluate  resolution. Electronically Signed   By: Randa Ngo M.D.   On: 07/01/2019 02:15   CT CHEST LUNG CA SCREEN LOW DOSE W/O CM  Result Date: 06/30/2019 CLINICAL DATA:  64 year old male current smoker with 74 pack-year history of smoking. Low-dose lung cancer screening examination. EXAM: CT CHEST WITHOUT CONTRAST LOW-DOSE FOR LUNG CANCER SCREENING TECHNIQUE: Multidetector CT imaging of the chest was performed following the standard protocol without IV contrast. COMPARISON:  Lung cancer screening examination 11/12/2017. FINDINGS: Cardiovascular: Heart size is normal. There is no significant pericardial fluid, thickening or pericardial calcification. There is aortic atherosclerosis, as well as atherosclerosis of the great vessels of the mediastinum and the coronary arteries, including calcified atherosclerotic plaque in the left main, left anterior descending, left circumflex and right coronary arteries. Mediastinum/Nodes: No pathologically enlarged mediastinal or hilar lymph nodes. Please note that accurate exclusion of hilar adenopathy is limited on noncontrast CT scans. Esophagus is unremarkable in appearance. No axillary lymphadenopathy. Lungs/Pleura: Multiple small pulmonary nodules scattered throughout the lungs, similar to the prior study. In addition, today's examination is limited by the presence of ground-glass attenuation, septal thickening, thickening of the peribronchovascular interstitium and regional architectural distortion in the periphery of the left lung involving portions of both the left upper and lower lobes, likely to represent an evolving infectious process. No pleural effusions. Mild diffuse bronchial wall thickening with mild centrilobular and paraseptal emphysema. Upper Abdomen: Aortic atherosclerosis. Exophytic 2.3 cm low-attenuation lesion in the medial aspect of the upper pole the right kidney, incompletely characterized on today's non-contrast CT examination, but similar to prior  studies and statistically likely to represent a cyst. Musculoskeletal: There are no aggressive appearing lytic or blastic lesions noted in the visualized portions of the skeleton. IMPRESSION: 1. Lung-RADS 0S, incomplete. Today's study is limited by what appears to be a resolving multilobar left-sided pneumonia, as above. Repeat screening  with low-dose chest CT without contrast in 3 months is recommended. 2. The "S" modifier above refers to potentially clinically significant non lung cancer related findings. Specifically, there is aortic atherosclerosis, in addition to left main and 3 vessel coronary artery disease. Please note that although the presence of coronary artery calcium documents the presence of coronary artery disease, the severity of this disease and any potential stenosis cannot be assessed on this non-gated CT examination. Assessment for potential risk factor modification, dietary therapy or pharmacologic therapy may be warranted, if clinically indicated. 3. Mild diffuse bronchial wall thickening with mild centrilobular and paraseptal emphysema; imaging findings suggestive of underlying COPD. Aortic Atherosclerosis (ICD10-I70.0) and Emphysema (ICD10-J43.9). Electronically Signed   By: Trudie Reed M.D.   On: 06/30/2019 11:29    Procedures .Critical Care Performed by: Jeanie Sewer, PA-C Authorized by: Jeanie Sewer, PA-C   Critical care provider statement:    Critical care time (minutes):  75   Critical care was necessary to treat or prevent imminent or life-threatening deterioration of the following conditions:  Cardiac failure   Critical care was time spent personally by me on the following activities:  Discussions with consultants, evaluation of patient's response to treatment, examination of patient, ordering and performing treatments and interventions, ordering and review of laboratory studies, ordering and review of radiographic studies, pulse oximetry, re-evaluation of patient's  condition, obtaining history from patient or surrogate and review of old charts   (including critical care time)  Medications Ordered in ED Medications  nitroGLYCERIN 50 mg in dextrose 5 % 250 mL (0.2 mg/mL) infusion (10 mcg/min Intravenous Rate/Dose Change 07/01/19 0546)  aspirin EC tablet 81 mg (has no administration in time range)  acetaminophen (TYLENOL) tablet 650 mg (has no administration in time range)  ondansetron (ZOFRAN) injection 4 mg (has no administration in time range)  heparin ADULT infusion 100 units/mL (25000 units/26mL sodium chloride 0.45%) (1,450 Units/hr Intravenous New Bag/Given 07/01/19 0300)  atorvastatin (LIPITOR) tablet 80 mg (has no administration in time range)  allopurinol (ZYLOPRIM) tablet 300 mg (has no administration in time range)  buPROPion (WELLBUTRIN XL) 24 hr tablet 300 mg (has no administration in time range)  DULoxetine (CYMBALTA) DR capsule 120 mg (has no administration in time range)  ezetimibe (ZETIA) tablet 10 mg (has no administration in time range)  metoprolol tartrate (LOPRESSOR) tablet 12.5 mg (has no administration in time range)  insulin pump (has no administration in time range)  sodium chloride flush (NS) 0.9 % injection 3 mL (3 mLs Intravenous Given 07/01/19 0212)  fentaNYL (SUBLIMAZE) injection 100 mcg (100 mcg Intravenous Given 07/01/19 0220)  ondansetron (ZOFRAN) injection 4 mg (4 mg Intravenous Given 07/01/19 0209)  aspirin 325 MG tablet (  Given 07/01/19 0212)  ondansetron (ZOFRAN) 4 MG/2ML injection (  Given 07/01/19 0213)  heparin 5000 UNIT/ML injection (  Given 07/01/19 0212)    ED Course  I have reviewed the triage vital signs and the nursing notes.  Pertinent labs & imaging results that were available during my care of the patient were reviewed by me and considered in my medical decision making (see chart for details).    MDM Rules/Calculators/A&P                      Patient presenting for evaluation of sudden onset chest pain  with associated lightheadedness, diaphoresis, radiation to the bilateral upper extremities.  The patient is afebrile, mildly hypertensive on arrival.  EKG concerning for anterior septal infarct  with elevations in V2 and V3.  Repeat EKG in the ED shows persistent elevations in leads V2 and V3 as well as now inversions in lead aVL. Concern for acute MI with patient's symptoms and risk factors.  Code STEMI was called and patient was given IV heparin in the ED.  2:15AM CONSULT: Dr. Nicanor AlconPalumbo spoke with Dr. Herbie BaltimoreHarding who reviewed patient's EKGs. He states he is not convinced that the EKGs indicate acute MI but recommends cardiology fellow evaluate the patient emergently in the ED.    2:22AM Spoke with Dr. Electa SniffBarnett outside patient's room. Initial EKG in the field is concerning, though he feels some of the repeat EKGs here show improvement. With patient's symptoms and abnormal EKG, plan for cardiology admission and medical management. Code STEMI was canceled by on-call Cardiology service as they did not agree that EKG changes were consistent with STEMI. Plan for likely cath in the morning.   Labwork was collected at 2:12AM. At 3:15AM and noted the lab work was not yet in process.  I called over to the lab and they informed me that they had never received the patient's blood work.  Blood work was redrawn and sent to the lab.  4:45AM Patient's troponin and BMP has still not resulted.  Spoke with the lab and they inform me that the delay was due to need to delete the troponin as it was greater than 27,000.  With regards to the rest of the patient's lab work, he exhibits leukocytosis but no anemia, elevated hemoglobin A1c and TSH, hyperglycemia but renal function within normal limits.  5:45 AM Spoke with Dr. Electa SniffBarnett with cardiology. Informed him of elevated troponin and complaint of 4/10 pain. He requests the patient NTG dose be increased. Will inform nursing staff. Patient will need to go to step-down level of  care.  Plan for cath in the morning.  Repeat EKG obtained which shows persistent elevation in leads V2 and V3 as well as reciprocal changes in lead aVL.   Patient was seen in conjunction with myself and Dr. Nicanor AlconPalumbo. Final Clinical Impression(s) / ED Diagnoses Final diagnoses:  ST elevation myocardial infarction (STEMI), unspecified artery [facility](HCC)    Rx / DC Orders ED Discharge Orders    None       Bennye AlmFawze, Demetri Goshert A, PA-C 07/01/19 [id number]    Palumbo, April, MD 07/01/19 [phone]

## 2019-07-01 NOTE — ED Notes (Signed)
Called lab to see why troponin hasn't been resulted, lab reported that specimen had to to be diluted.

## 2019-07-01 NOTE — ED Triage Notes (Signed)
Pt from home for chest pain that started at 2pm while cooking. Took Pepto and Nsaids without relief. Pt clammy on arrival. Reports R sided pain chest pain with dull, constant pain. Denies cardiac hx. Insulin pump to R arm. EMS gave 324mg  ASA

## 2019-07-01 NOTE — Interval H&P Note (Signed)
Cath Lab Visit (complete for each Cath Lab visit)  Clinical Evaluation Leading to the Procedure:   ACS: Yes.    Non-ACS:    Anginal Classification: CCS IV  Anti-ischemic medical therapy: Maximal Therapy (2 or more classes of medications)  Non-Invasive Test Results: No non-invasive testing performed  Prior CABG: No previous CABG      History and Physical Interval Note:  07/01/2019 11:40 AM  Gabriel Bean  has presented today for surgery, with the diagnosis of nonstemi.  The various methods of treatment have been discussed with the patient and family. After consideration of risks, benefits and other options for treatment, the patient has consented to  Procedure(s): LEFT HEART CATH AND CORONARY ANGIOGRAPHY (N/A) as a surgical intervention.  The patient's history has been reviewed, patient examined, no change in status, stable for surgery.  I have reviewed the patient's chart and labs.  Questions were answered to the patient's satisfaction.     Gabriel Bean

## 2019-07-01 NOTE — ED Notes (Signed)
Troponin greater than 27000, MD notified

## 2019-07-02 DIAGNOSIS — I10 Essential (primary) hypertension: Secondary | ICD-10-CM

## 2019-07-02 DIAGNOSIS — I214 Non-ST elevation (NSTEMI) myocardial infarction: Secondary | ICD-10-CM | POA: Diagnosis not present

## 2019-07-02 DIAGNOSIS — E1159 Type 2 diabetes mellitus with other circulatory complications: Secondary | ICD-10-CM

## 2019-07-02 DIAGNOSIS — Z955 Presence of coronary angioplasty implant and graft: Secondary | ICD-10-CM | POA: Diagnosis not present

## 2019-07-02 DIAGNOSIS — E7800 Pure hypercholesterolemia, unspecified: Secondary | ICD-10-CM

## 2019-07-02 DIAGNOSIS — E119 Type 2 diabetes mellitus without complications: Secondary | ICD-10-CM

## 2019-07-02 DIAGNOSIS — Z794 Long term (current) use of insulin: Secondary | ICD-10-CM

## 2019-07-02 DIAGNOSIS — I255 Ischemic cardiomyopathy: Secondary | ICD-10-CM

## 2019-07-02 HISTORY — DX: Essential (primary) hypertension: I10

## 2019-07-02 LAB — CBC
HCT: 39.6 % (ref 39.0–52.0)
Hemoglobin: 13.3 g/dL (ref 13.0–17.0)
MCH: 31.7 pg (ref 26.0–34.0)
MCHC: 33.6 g/dL (ref 30.0–36.0)
MCV: 94.5 fL (ref 80.0–100.0)
Platelets: 181 10*3/uL (ref 150–400)
RBC: 4.19 MIL/uL — ABNORMAL LOW (ref 4.22–5.81)
RDW: 14.1 % (ref 11.5–15.5)
WBC: 9.6 10*3/uL (ref 4.0–10.5)
nRBC: 0 % (ref 0.0–0.2)

## 2019-07-02 LAB — BASIC METABOLIC PANEL
Anion gap: 12 (ref 5–15)
BUN: 10 mg/dL (ref 8–23)
CO2: 22 mmol/L (ref 22–32)
Calcium: 8.9 mg/dL (ref 8.9–10.3)
Chloride: 104 mmol/L (ref 98–111)
Creatinine, Ser: 1.1 mg/dL (ref 0.61–1.24)
GFR calc Af Amer: >60 mL/min (ref >60)
GFR calc non Af Amer: >60 mL/min (ref >60)
Glucose, Bld: 143 mg/dL — ABNORMAL HIGH (ref 70–99)
Potassium: 3.8 mmol/L (ref 3.5–5.1)
Sodium: 138 mmol/L (ref 135–145)

## 2019-07-02 LAB — GLUCOSE, CAPILLARY
Glucose-Capillary: 140 mg/dL — ABNORMAL HIGH (ref 70–99)
Glucose-Capillary: 138 mg/dL — ABNORMAL HIGH (ref 70–99)
Glucose-Capillary: 152 mg/dL — ABNORMAL HIGH (ref 70–99)

## 2019-07-02 MED ORDER — ATORVASTATIN CALCIUM 80 MG PO TABS: 80.0000 mg | ORAL_TABLET | Freq: Every day | ORAL | 3 refills | Status: DC

## 2019-07-02 MED ORDER — ASPIRIN 81 MG PO CHEW: 81.0000 mg | CHEWABLE_TABLET | Freq: Every day | ORAL | Status: AC

## 2019-07-02 MED ORDER — LOSARTAN POTASSIUM 25 MG PO TABS: 12.5000 mg | ORAL_TABLET | Freq: Every day | ORAL | 3 refills | Status: DC

## 2019-07-02 MED ORDER — TICAGRELOR 90 MG PO TABS: 90.0000 mg | ORAL_TABLET | Freq: Two times a day (BID) | ORAL | 3 refills | Status: DC

## 2019-07-02 MED ORDER — METOPROLOL TARTRATE 25 MG PO TABS: 12.5000 mg | ORAL_TABLET | Freq: Two times a day (BID) | ORAL | 3 refills | Status: DC

## 2019-07-02 MED FILL — Verapamil HCl IV Soln 2.5 MG/ML: INTRAVENOUS | Qty: 2 | Status: AC

## 2019-07-02 MED FILL — BRILINTA 90 MG TABLET: 90 | 30 days supply | Qty: 60 | Fill #0

## 2019-07-02 MED FILL — LOSARTAN POTASSIUM 25 MG TA: 25 | 60 days supply | Qty: 30 | Fill #0

## 2019-07-02 MED FILL — METOPROLOL TARTRATE 25 MG T: 25 | 60 days supply | Qty: 60 | Fill #0

## 2019-07-02 MED FILL — ATORVASTATIN CALCIUM 80 MG: 80 | 60 days supply | Qty: 60 | Fill #0

## 2019-07-02 NOTE — Care Management (Signed)
1221 07-02-19 Case Manager provided patient with Brilinta discount card to provide to his pharmacy for additional discount. No further needs from Case Manager at this time. Gala Lewandowsky, RN,BSN Case Manager

## 2019-07-02 NOTE — Progress Notes (Signed)
CARDIAC REHAB PHASE I   PRE:  Rate/Rhythm: 82 SR  BP:  Sitting: 112/74      SaO2: 98 RA  MODE:  Ambulation: 350 ft   POST:  Rate/Rhythm: 105 ST  BP:  Sitting: 130/81    SaO2: 97 RA   Pt ambulated 343ft in hallway handheld assist with slow steady gait. Pt denies CP, SOB or dizziness. Pt returned to recliner. Pt educated on importance of ASA, Brilinta, statin, and NTG. Pt given MI book and HF guidelines, along with heart healthy, diabetic, and low sodium diets. Encouraged smoking cessation, pt states he has never tried to quit before but is thinking about it. Reviewed restrictions, site care, exercise guidelines, and additional risk factors. Will refer to CRP II [place]. Pt is interested in participating in Virtual Cardiac and Pulmonary Rehab. Pt advised that Virtual Cardiac and Pulmonary Rehab is provided at no cost to the patient.  Checklist:  1. Pt has smart device  ie smartphone and/or ipad for downloading an app  Yes 2. Reliable internet/wifi service    Yes 3. Understands how to use their smartphone and navigate within an app.  Yes  Pt verbalized understanding and is in agreement.  [id number] Reynold Bowen, RN BSN 07/02/2019 9:48 AM

## 2019-07-02 NOTE — Progress Notes (Signed)
Submitted to check benefit eligibility regarding Brilita

## 2019-07-02 NOTE — Discharge Summary (Signed)
Discharge Summary    Patient ID: Gabriel Bean MRN: [id number]; DOB: [birth year]  Admit date: 07/01/2019 Discharge date: 07/02/2019  Primary Care Provider: Merlene LaughterStoneking, Hal, MD  Primary Cardiologist: Jodelle RedBridgette Christopher, MD   Discharge Diagnoses    Principal Problem:   ST elevation myocardial infarction (STEMI) [facility](HCC) Active Problems:   Tobacco abuse   Chest pain   HTN (hypertension)   DM (diabetes mellitus) (HCC)  Diagnostic Studies/Procedures    LHC 07/01/19:   Prox RCA to Mid RCA lesion is 60% stenosed.  Prox LAD to Mid LAD lesion is 100% stenosed.  Post intervention, there is a 0% residual stenosis.  A stent was successfully placed.   Total occlusion of the proximal LAD with initial TIMI 0 flow and faint septal collateralization via the distal RCA.  Normal left circumflex coronary artery.    Dominant RCA with 60% mid RCA stenosis..  Low normal to mildly impaired LV function with EF estimate at 50%; LVEDP 16 mmHg.  PCI to the LAD with ultimate insertion of a 3.0 x 26 mm Resolute Onyx DES stent postdilated to 3.25 mm with the 100% occlusion being reduced to 0% and TIMI 0 flow being improved to TIMI-3 flow.  RECOMMENDATION: DAPT for minimum of 1 year.  Smoking cessation is essential.  Optimal blood pressure control with target blood pressure less than 130/80 and ideal less than 120/80.  Aggressive lipid-lowering therapy with LDL goal less than 70.    Echocardiogram 07/01/19:  1. Left ventricular ejection fraction, by estimation, is 35 to 40%. The  left ventricle has moderately decreased function. The left ventricle  demonstrates regional wall motion abnormalities (see scoring  diagram/findings for description). The left  ventricular internal cavity size was mildly dilated. Left ventricular  diastolic parameters are consistent with Grade I diastolic dysfunction  (impaired relaxation). Elevated left atrial pressure.  2. Right ventricular systolic function  is normal. The right ventricular  size is normal.  3. The mitral valve is normal in structure. No evidence of mitral valve  regurgitation. No evidence of mitral stenosis.  4. The aortic valve is normal in structure. Aortic valve regurgitation is  not visualized. No aortic stenosis is present.  5. The inferior vena cava is normal in size with greater than 50%  respiratory variability, suggesting right atrial pressure of 3 mmHg.   History of Present Illness     Gabriel Bean is a 64 y.o. male with a hx of diabetes, HTN, tobacco use, and no cardiac history who [facility].  Gabriel Bean is a pleasant gentleman without prior known heart history, but PMH of type II diabetes, hypertension, tobacco use who presented with chest pain. He never had chest pain until about 3 days prior to presentation, when he had a pressure like sensation in the center of his chest that went away. On 06/30/19 in the afternoon he was standing up, cooking in the kitchen, and he developed 7/10 central chest pain/pressure that radiated to both shoulders and down both arms. He was mildly short of breath and very clammy. He presented to the ER for this.  Initial ECG here with borderline ST elevations in V1-V3. Multiple ECGs were done overnight, and the ST changes appeared to improve. However, this morning's ECG with concerning ST segments in V2-V3. He tells me he has had continued chest pain overnight, though improved to 3/10. His hsTn >27,000.  His father had history of HTN and brother had a congenital heart disease.   Hospital  Course     Plan was for [facility] which was performed on 07/01/19 which showed total occlusion of the proximal LAD with initial with faint septal collateralization via the distal RCA, normal left circumflex coronary artery, dominant RCA with 60% mid RCA stenosis. PCI to the LAD was successfully performed. Recommendations were for DAPT for minimum of 1 year.  Smoking cessation is  essential.  Optimal blood pressure control with target blood pressure less than 130/80 and ideal less than 120/80.  Aggressive lipid-lowering therapy with LDL goal less than 70.    NSTEMI, borderline STEMI -Classic symptoms at rest with LHC results as above  -DES/PCI to LAD with recommendations for DAPT for minimum of 1 year with ASA and Brillinta -CV risk factors of type II diabetes, tobacco use, obesity and hypertension -ECG as above, concerning for borderline STEMI -hsTN >27,000 -On ASA 81 mg -Started on atorvastatin 80 mg daily>>was on rosuvastatin  -On ezetimbe 10 mg at home, continued -Metoprolol succinate 100 mg daily at home, changed to 12.5 mg BID last night  Hypertension: -Stable, 112/74>125/82>106/71 -Restart losartan-HCTZ at discharge   Type II diabetes: -On Januvia as an outpatient with insulin pump  Tobacco Use: -Cessation strongly encouraged   Consultants: None   The patient was seen and examined by Dr. Cristal Deer who feels that he is stable and ready for discharge today, 07/02/19. Cath site stable. Has ambulated with cardiac rehab without issues.   Did the patient have an acute coronary syndrome (MI, NSTEMI, STEMI, etc) this admission?:  Yes                               AHA/ACC Clinical Performance & Quality Measures: 1. Aspirin prescribed? - Yes 2. ADP Receptor Inhibitor (Plavix/Clopidogrel, Brilinta/Ticagrelor or Effient/Prasugrel) prescribed (includes medically managed patients)? - Yes 3. Beta Blocker prescribed? - Yes 4. High Intensity Statin (Lipitor 40-80mg  or Crestor 20-40mg ) prescribed? - Yes 5. EF assessed during THIS hospitalization? - Yes 6. For EF <40%, was ACEI/ARB prescribed? - Yes 7. For EF <40%, Aldosterone Antagonist (Spironolactone or Eplerenone) prescribed? - No - Reason:  renal function  8. Cardiac Rehab Phase II ordered (Included Medically managed Patients)? - Yes   Discharge Vitals Blood pressure 112/74, pulse 92, temperature 99.3 F  (37.4 C), temperature source Oral, resp. rate 17, height 6' (1.829 m), weight 119.6 kg, SpO2 98 %.  Filed Weights   07/01/19 0200 07/02/19 0420  Weight: 120.2 kg 119.6 kg    Labs & Radiologic Studies    CBC Recent Labs    07/01/19 1343 07/02/19 0325  WBC 13.6* 9.6  HGB 13.8 13.3  HCT 41.1 39.6  MCV 95.1 94.5  PLT 205 181   Basic Metabolic Panel Recent Labs    16/10/96 0343 07/01/19 0343 07/01/19 1343 07/02/19 0325  NA 135  --   --  138  K 4.2  --   --  3.8  CL 103  --   --  104  CO2 21*  --   --  22  GLUCOSE 179*  --   --  143*  BUN 14  --   --  10  CREATININE 1.22   < > 1.15 1.10  CALCIUM 8.8*  --   --  8.9   < > = values in this interval not displayed.   Liver Function Tests No results for input(s): AST, ALT, ALKPHOS, BILITOT, PROT, ALBUMIN in the last 72 hours. No results for  input(s): LIPASE, AMYLASE in the last 72 hours. High Sensitivity Troponin:   Recent Labs  Lab 07/01/19 0343 07/01/19 1343 07/01/19 1513  TROPONINIHS >27,000* >27,000* >27,000*    BNP Invalid input(s): POCBNP D-Dimer No results for input(s): DDIMER in the last 72 hours. Hemoglobin A1C Recent Labs    07/01/19 0343  HGBA1C 7.0*   Fasting Lipid Panel Recent Labs    07/01/19 0343  CHOL 158  HDL 53  LDLCALC 95  TRIG 48  CHOLHDL 3.0   Thyroid Function Tests Recent Labs    07/01/19 0343  TSH 4.530*   _____________  CARDIAC CATHETERIZATION  Result Date: 07/01/2019  Prox RCA to Mid RCA lesion is 60% stenosed.  Prox LAD to Mid LAD lesion is 100% stenosed.  Post intervention, there is a 0% residual stenosis.  A stent was successfully placed.  Total occlusion of the proximal LAD with initial TIMI 0 flow and faint septal collateralization via the distal RCA. Normal left circumflex coronary artery.  Dominant RCA with 60% mid RCA stenosis.. Low normal to mildly impaired LV function with EF estimate at 50%; LVEDP 16 mmHg. PCI to the LAD with ultimate insertion of a 3.0 x 26 mm  Resolute Onyx DES stent postdilated to 3.25 mm with the 100% occlusion being reduced to 0% and TIMI 0 flow being improved to TIMI-3 flow. RECOMMENDATION: DAPT for minimum of 1 year.  Smoking cessation is essential.  Optimal blood pressure control with target blood pressure less than 130/80 and ideal less than 120/80.  Aggressive lipid-lowering therapy with LDL goal less than 70.   DG Chest Portable 1 View  Result Date: 07/01/2019 CLINICAL DATA:  Right-sided chest pain EXAM: PORTABLE CHEST 1 VIEW COMPARISON:  11/09/2015, 06/29/2019 FINDINGS: 2 frontal views of the chest demonstrate the peripheral ground-glass left upper lobe airspace disease seen on recent chest CT. No appreciable change. No effusion or pneumothorax. Cardiac silhouette is unremarkable. No acute bony abnormalities. IMPRESSION: 1. Continued left upper lobe ground-glass airspace disease which may reflect pneumonia. Previous CT examination recommended 3 month follow-up study to evaluate resolution. Electronically Signed   By: Sharlet Salina M.D.   On: 07/01/2019 02:15   ECHOCARDIOGRAM COMPLETE  Result Date: 07/01/2019    ECHOCARDIOGRAM REPORT   Patient Name:   BHAVIK CABINESS Date of Exam: 07/01/2019 Medical Rec #:  [id number]       Height:       72.0 in Accession #:    [id number]      Weight:       265.0 lb Date of Birth:  24-Nov-1955       BSA:          2.401 m Patient Age:    64 years        BP:           127/83 mmHg Patient Gender: M               HR:           68 bpm. Exam Location:  Inpatient Procedure: 2D Echo Indications:    acute coronary syndrome I24.9  History:        Patient has no prior history of Echocardiogram examinations.                 Risk Factors:Hypertension and Current Smoker.  Sonographer:    Celene Skeen RDCS (AE) Referring Phys: [phone] ADAM S BARNETT IMPRESSIONS  1. Left ventricular ejection fraction, by estimation, is 35 to 40%. The left  ventricle has moderately decreased function. The left ventricle demonstrates regional  wall motion abnormalities (see scoring diagram/findings for description). The left ventricular internal cavity size was mildly dilated. Left ventricular diastolic parameters are consistent with Grade I diastolic dysfunction (impaired relaxation). Elevated left atrial pressure.  2. Right ventricular systolic function is normal. The right ventricular size is normal.  3. The mitral valve is normal in structure. No evidence of mitral valve regurgitation. No evidence of mitral stenosis.  4. The aortic valve is normal in structure. Aortic valve regurgitation is not visualized. No aortic stenosis is present.  5. The inferior vena cava is normal in size with greater than 50% respiratory variability, suggesting right atrial pressure of 3 mmHg. FINDINGS  Left Ventricle: Left ventricular ejection fraction, by estimation, is 35 to 40%. The left ventricle has moderately decreased function. The left ventricle demonstrates regional wall motion abnormalities. The left ventricular internal cavity size was mildly dilated. There is no left ventricular hypertrophy. Left ventricular diastolic parameters are consistent with Grade I diastolic dysfunction (impaired relaxation). Elevated left atrial pressure.  LV Wall Scoring: The mid and distal anterior wall, mid and distal anterior septum, mid inferoseptal segment, and apex are akinetic. Right Ventricle: The right ventricular size is normal. No increase in right ventricular wall thickness. Right ventricular systolic function is normal. Left Atrium: Left atrial size was normal in size. Right Atrium: Right atrial size was normal in size. Pericardium: There is no evidence of pericardial effusion. Mitral Valve: The mitral valve is normal in structure. Normal mobility of the mitral valve leaflets. No evidence of mitral valve regurgitation. No evidence of mitral valve stenosis. Tricuspid Valve: The tricuspid valve is normal in structure. Tricuspid valve regurgitation is mild . No evidence of  tricuspid stenosis. Aortic Valve: The aortic valve is normal in structure. Aortic valve regurgitation is not visualized. No aortic stenosis is present. Pulmonic Valve: The pulmonic valve was normal in structure. Pulmonic valve regurgitation is not visualized. No evidence of pulmonic stenosis. Aorta: The aortic root is normal in size and structure. Venous: The inferior vena cava is normal in size with greater than 50% respiratory variability, suggesting right atrial pressure of 3 mmHg. IAS/Shunts: No atrial level shunt detected by color flow Doppler.  LEFT VENTRICLE PLAX 2D LVIDd:         5.50 cm  Diastology LVIDs:         3.70 cm  LV e' lateral:   7.51 cm/s LV PW:         1.20 cm  LV E/e' lateral: 12.3 LV IVS:        0.90 cm  LV e' medial:    5.11 cm/s LVOT diam:     2.20 cm  LV E/e' medial:  18.1 LV SV:         66 LV SV Index:   27 LVOT Area:     3.80 cm  LEFT ATRIUM         Index      RIGHT ATRIUM           Index LA diam:    3.90 cm 1.62 cm/m RA Area:     17.10 cm                                RA Volume:   47.60 ml  19.82 ml/m  AORTIC VALVE LVOT Vmax:   87.50 cm/s LVOT Vmean:  54.200 cm/s LVOT VTI:  0.173 m  AORTA Ao Root diam: 3.40 cm MITRAL VALVE MV Area (PHT): 5.66 cm     SHUNTS MV Decel Time: 134 msec     Systemic VTI:  0.17 m MV E velocity: 92.50 cm/s   Systemic Diam: 2.20 cm MV A velocity: 113.00 cm/s MV E/A ratio:  0.82 Tobias Alexander MD Electronically signed by Tobias Alexander MD Signature Date/Time: 07/01/2019/11:09:28 PM    Final    CT CHEST LUNG CA SCREEN LOW DOSE W/O CM  Result Date: 06/30/2019 CLINICAL DATA:  64 year old male current smoker with 74 pack-year history of smoking. Low-dose lung cancer screening examination. EXAM: CT CHEST WITHOUT CONTRAST LOW-DOSE FOR LUNG CANCER SCREENING TECHNIQUE: Multidetector CT imaging of the chest was performed following the standard protocol without IV contrast. COMPARISON:  Lung cancer screening examination 11/12/2017. FINDINGS: Cardiovascular: Heart  size is normal. There is no significant pericardial fluid, thickening or pericardial calcification. There is aortic atherosclerosis, as well as atherosclerosis of the great vessels of the mediastinum and the coronary arteries, including calcified atherosclerotic plaque in the left main, left anterior descending, left circumflex and right coronary arteries. Mediastinum/Nodes: No pathologically enlarged mediastinal or hilar lymph nodes. Please note that accurate exclusion of hilar adenopathy is limited on noncontrast CT scans. Esophagus is unremarkable in appearance. No axillary lymphadenopathy. Lungs/Pleura: Multiple small pulmonary nodules scattered throughout the lungs, similar to the prior study. In addition, today's examination is limited by the presence of ground-glass attenuation, septal thickening, thickening of the peribronchovascular interstitium and regional architectural distortion in the periphery of the left lung involving portions of both the left upper and lower lobes, likely to represent an evolving infectious process. No pleural effusions. Mild diffuse bronchial wall thickening with mild centrilobular and paraseptal emphysema. Upper Abdomen: Aortic atherosclerosis. Exophytic 2.3 cm low-attenuation lesion in the medial aspect of the upper pole the right kidney, incompletely characterized on today's non-contrast CT examination, but similar to prior studies and statistically likely to represent a cyst. Musculoskeletal: There are no aggressive appearing lytic or blastic lesions noted in the visualized portions of the skeleton. IMPRESSION: 1. Lung-RADS 0S, incomplete. Today's study is limited by what appears to be a resolving multilobar left-sided pneumonia, as above. Repeat screening with low-dose chest CT without contrast in 3 months is recommended. 2. The "S" modifier above refers to potentially clinically significant non lung cancer related findings. Specifically, there is aortic atherosclerosis, in  addition to left main and 3 vessel coronary artery disease. Please note that although the presence of coronary artery calcium documents the presence of coronary artery disease, the severity of this disease and any potential stenosis cannot be assessed on this non-gated CT examination. Assessment for potential risk factor modification, dietary therapy or pharmacologic therapy may be warranted, if clinically indicated. 3. Mild diffuse bronchial wall thickening with mild centrilobular and paraseptal emphysema; imaging findings suggestive of underlying COPD. Aortic Atherosclerosis (ICD10-I70.0) and Emphysema (ICD10-J43.9). Electronically Signed   By: Trudie Reed M.D.   On: 06/30/2019 11:29   Disposition   Pt is being discharged home today in good condition.  Follow-up Plans & Appointments   Follow-up Information    Beatriz Stallion., PA-C Follow up on 07/12/2019.   Specialty: Physician Assistant Why: at 1015am Contact information: [address] [place] [address] [place] [address] [phone]          Discharge Instructions    Amb Referral to Cardiac Rehabilitation   Complete by: As directed    Diagnosis:  Coronary Stents NSTEMI STEMI  After initial evaluation and assessments completed: Virtual Based Care may be provided alone or in conjunction with Phase 2 Cardiac Rehab based on patient barriers.: Yes   Call MD for:  difficulty breathing, headache or visual disturbances   Complete by: As directed    Call MD for:  extreme fatigue   Complete by: As directed    Call MD for:  hives   Complete by: As directed    Call MD for:  persistant dizziness or light-headedness   Complete by: As directed    Call MD for:  persistant nausea and vomiting   Complete by: As directed    Call MD for:  redness, tenderness, or signs of infection (pain, swelling, redness, odor or green/yellow discharge around incision site)   Complete by: As directed    Call MD for:  severe uncontrolled pain    Complete by: As directed    Call MD for:  temperature >100.4   Complete by: As directed    Diet - low sodium heart healthy   Complete by: As directed    Discharge instructions   Complete by: As directed    PLEASE DO NOT MISS ANY DOSES OF YOUR BRILINTA!!!!! Also keep a log of you blood pressures and bring back to your follow up appt. Please call the office with any questions.   Patients taking blood thinners should generally stay away from medicines like ibuprofen, Advil, Motrin, naproxen, and Aleve due to risk of stomach bleeding. You may take Tylenol as directed or talk to your primary doctor about alternatives.  No driving for 3 days. No lifting over 5 lbs for 1 week. No sexual activity for 1 week. Keep procedure site clean & dry. If you notice increased pain, swelling, bleeding or pus, call/return!  You may shower, but no soaking baths/hot tubs/pools for 1 week.   One of your heart tests showed weakness of the heart muscle this admission. This may make you more susceptible to weight gain from fluid retention, which can lead to symptoms that we call heart failure. Please follow these special instructions:  1. Follow a low-salt diet - you are allowed no more than 2,000mg  of sodium per day. Watch your fluid intake. In general, you should not be taking in more than 2 liters of fluid per day (no more than 8 glasses per day). This includes sources of water in foods like soup, coffee, tea, milk, etc. 2. Weigh yourself on the same scale at same time of day and keep a log. 3. Call your doctor: (Anytime you feel any of the following symptoms)  - 3lb weight gain overnight or 5lb within a few days - Shortness of breath, with or without a dry hacking cough  - Swelling in the hands, feet or stomach  - If you have to sleep on extra pillows at night in order to breathe   IT IS IMPORTANT TO LET YOUR DOCTOR KNOW EARLY ON IF YOU ARE HAVING SYMPTOMS SO WE CAN HELP YOU!   Increase activity slowly   Complete  by: As directed      Discharge Medications   Allergies as of 07/02/2019   No Known Allergies     Medication List    STOP taking these medications   amLODipine 2.5 MG tablet Commonly known as: NORVASC   losartan-hydrochlorothiazide 100-12.5 MG tablet Commonly known as: HYZAAR   metoprolol succinate 100 MG 24 hr tablet Commonly known as: TOPROL-XL   rosuvastatin 5 MG tablet Commonly known as: CRESTOR  TAKE these medications   allopurinol 300 MG tablet Commonly known as: ZYLOPRIM Take 300 mg by mouth daily.   aspirin 81 MG chewable tablet Chew 1 tablet (81 mg total) by mouth daily. Start taking on: July 03, 2019   atorvastatin 80 MG tablet Commonly known as: LIPITOR Take 1 tablet (80 mg total) by mouth daily. Start taking on: July 03, 2019   buPROPion 300 MG 24 hr tablet Commonly known as: WELLBUTRIN XL Take 300 mg by mouth daily.   DULoxetine 60 MG capsule Commonly known as: CYMBALTA Take 120 mg by mouth daily.   ezetimibe 10 MG tablet Commonly known as: ZETIA Take 10 mg by mouth daily.   insulin pump Soln Inject into the skin continuous. novolog   Januvia 100 MG tablet Generic drug: sitaGLIPtin Take 100 mg by mouth daily.   losartan 25 MG tablet Commonly known as: Cozaar Take 0.5 tablets (12.5 mg total) by mouth daily.   metoprolol tartrate 25 MG tablet Commonly known as: LOPRESSOR Take 0.5 tablets (12.5 mg total) by mouth 2 (two) times daily.   ticagrelor 90 MG Tabs tablet Commonly known as: BRILINTA Take 1 tablet (90 mg total) by mouth 2 (two) times daily.       Outstanding Labs/Studies   BMET   Duration of Discharge Encounter   Greater than 30 minutes including physician time.  Signed, Kathyrn Drown, NP 07/02/2019, 12:05 PM

## 2019-07-02 NOTE — Progress Notes (Signed)
EKG done this morning by NT showed STEMI. Repeat EKG done by the writer did not show STEMI; it is similar to the EKG done on 07/01/2019. Patient is stable, denies chest pain.  Will continue to monitor patient.

## 2019-07-02 NOTE — TOC Benefit Eligibility Note (Signed)
Transition of Care [facility]) Benefit Eligibility Note    Patient Details  Name: Gabriel Bean MRN: [id number] Date of Birth: 1955/12/26   Medication/Dose: Kary Kos  90 MG BID  Covered?: Yes  Tier: 3 Drug  Prescription Coverage Preferred Pharmacy: Roseanne Kaufman with Person/Company/Phone Number:: CALISHA   @ [facility] RX # [phone] OPT- MEMBER  Co-Pay: $119.71  Prior Approval: No  Deductible: Unmet(OUT-OF-POCKET : NOT MET)       Memory Argue Phone Number: 07/02/2019, 11:04 AM

## 2019-07-02 NOTE — Progress Notes (Signed)
Inpatient Diabetes Program Recommendations  AACE/ADA: New Consensus Statement on Inpatient Glycemic Control (2015)  Target Ranges:  Prepandial:   less than 140 mg/dL      Peak postprandial:   less than 180 mg/dL (1-2 hours)      Critically ill patients:  140 - 180 mg/dL   Lab Results  Component Value Date   GLUCAP 152 (H) 07/02/2019   HGBA1C 7.0 (H) 07/01/2019    Review of Glycemic Control  Diabetes history: DM Sees Dr. Talmage Nap Endocrinologist Outpatient Diabetes medications: Omnipod insulin pump  Basal rates 2 units/hour Total 48 units in a 24 hour period Carb ratio 1 units for every 10 grams of carbs Correction factor 1 units drops glucose 50 points Target glucose 140 Active insulin time 4 hours  Pt uses Freestyle Libre 14 day sensor continuous glucose monitor.  Thanks,  Christena Deem RN, MSN, BC-ADM Inpatient Diabetes Coordinator Team Pager [phone] (8a-5p)

## 2019-07-05 ENCOUNTER — Telehealth (HOSPITAL_COMMUNITY): Payer: Self-pay

## 2019-07-05 DIAGNOSIS — G4733 Obstructive sleep apnea (adult) (pediatric): Secondary | ICD-10-CM | POA: Diagnosis not present

## 2019-07-05 NOTE — Telephone Encounter (Signed)
Pt insurance is active and benefits verified through [employer] $30, DED 0/0 met, out of pocket $5,500/$1,043.60 met, co-insurance 0%. no pre-authorization required, Cheryl/BCBS 07/05/2019_0 :45, REF# [id number]  Will contact patient to see if he is interested in the Cardiac Rehab Program. If interested, patient will need to complete follow up appt. Once completed, patient will be contacted for scheduling upon review by the RN Navigator.

## 2019-07-06 ENCOUNTER — Encounter: Payer: Self-pay | Admitting: Cardiovascular Disease

## 2019-07-10 NOTE — Progress Notes (Signed)
Cardiology Office Note   Date:  07/13/2019   ID:  Bean Gabriel, DOB 22-Jun-1955, MRN [id number]  PCP:  Merlene Laughter, MD  Cardiologist:  Jodelle Red, MD EP: None  Chief Complaint  Patient presents with  . Hospitalization Follow-up    recent NSTEMI      History of Present Illness: Gabriel Bean is a 64 y.o. male with PMH of recent NSTEMI with newly diagnosed CAD s/p PCI/DES to LAD, ischemic cardiomyopathy, HTN, HLD, DM type 2, and tobacco abuse, who presents for post-hospital follow-up.   He was admitted to [facility] 07/01/19-07/02/19 for chest pain where he was found to have an NSTEMI with HsTrop peaking at >27000. He was noted to have borderline STE in V1-3. He underwent a LHC 07/01/19 which showed total occlusion of the pLAD s/p successful PCI/DES, as well as 60% mRCA stenosis, and normal LCx. He was started on DAPT with aspirin and brilinta with recommendations to continue for a minimum of 1 year. Echo revealed EF 35-40% with RWMA, G1DD, and no significant valvular abnormalities. Crestor was transitioned to atorvastatin  He presents today for post-hospital follow-up of recent NSTEMI. He has been doing well since discharge. No recurrent chest pain. Doing well with his medications without significant side effects. Energy level is improving. No complaints of SOB, palpitations, dizziness, lightheadedness, syncope, LE edema, orthopnea, or PND.    Past Medical History:  Diagnosis Date  . Chronic rhinitis   . Coronary artery disease   . GERD (gastroesophageal reflux disease)   . History of skin cancer   . Hypertension   . Low libido   . OCD (obsessive compulsive disorder)   . OSA (obstructive sleep apnea)   . Seasonal allergies     Past Surgical History:  Procedure Laterality Date  . APPENDECTOMY    . COLONOSCOPY     x3  . CORONARY STENT INTERVENTION N/A 07/01/2019   Procedure: CORONARY STENT INTERVENTION;  Surgeon: Lennette Bihari, MD;  Location: [facility]AS[facility]  CV LAB;  Service: Cardiovascular;  Laterality: N/A;  LAD  . LEFT HEART CATH AND CORONARY ANGIOGRAPHY N/A 07/01/2019   Procedure: LEFT HEART CATH AND CORONARY ANGIOGRAPHY;  Surgeon: Lennette Bihari, MD;  Location: [facility] INVASIVE CV LAB;  Service: Cardiovascular;  Laterality: N/A;  . SKIN CANCER EXCISION    . TONSILLECTOMY    . WISDOM TOOTH EXTRACTION       Current Outpatient Medications  Medication Sig Dispense Refill  . allopurinol (ZYLOPRIM) 300 MG tablet Take 300 mg by mouth daily.    Marland Kitchen aspirin 81 MG chewable tablet Chew 1 tablet (81 mg total) by mouth daily.    Marland Kitchen atorvastatin (LIPITOR) 80 MG tablet Take 1 tablet (80 mg total) by mouth daily. 60 tablet 3  . buPROPion (WELLBUTRIN XL) 300 MG 24 hr tablet Take 300 mg by mouth daily.     . DULoxetine (CYMBALTA) 60 MG capsule Take 120 mg by mouth daily.    Marland Kitchen ezetimibe (ZETIA) 10 MG tablet Take 10 mg by mouth daily.    . Insulin Human (INSULIN PUMP) SOLN Inject into the skin continuous. novolog    . JANUVIA 100 MG tablet Take 100 mg by mouth daily.  5  . ticagrelor (BRILINTA) 90 MG TABS tablet Take 1 tablet (90 mg total) by mouth 2 (two) times daily. 120 tablet 3  . Continuous Blood Gluc Sensor (FREESTYLE LIBRE 14 DAY SENSOR) MISC SMARTSIG:1 Each Topical Every 2 Weeks    . metoprolol  succinate (TOPROL-XL) 50 MG 24 hr tablet Take 1 tablet (50 mg total) by mouth daily. Take with or immediately following a meal. 90 tablet 3  . NOVOLOG 100 UNIT/ML injection SMARTSIG:100 Unit(s) SUB-Q Daily    . sacubitril-valsartan (ENTRESTO) 24-26 MG Take 1 tablet by mouth 2 (two) times daily. 60 tablet 0   No current facility-administered medications for this visit.    Allergies:   Patient has no known allergies.    Social History:  The patient  reports that he has been smoking cigarettes. He has been smoking about 1.00 pack per day. His smokeless tobacco use includes snuff. He reports current alcohol use. He reports that he does not use drugs.   Family  History:  The patient's family history includes Cancer in his father; Colon cancer in his father; Heart disease in his brother and father; Other in his mother; Skin cancer in his father.    ROS:  Please see the history of present illness.   Otherwise, review of systems are positive for none.   All other systems are reviewed and negative.    PHYSICAL EXAM: VS:  BP (!) 146/90   Pulse 85   Temp (!) 97 F (36.1 C) Comment: Forehead  Ht 6' (1.829 m)   Wt 259 lb 9.6 oz (117.8 kg)   SpO2 98%   BMI 35.21 kg/m  , BMI Body mass index is 35.21 kg/m. GEN: Well nourished, well developed, in no acute distress HEENT: sclera anicteric Neck: no JVD, carotid bruits, or masses Cardiac: RRR; no murmurs, rubs, or gallops, no edema  Respiratory:  clear to auscultation bilaterally, normal work of breathing GI: soft, nontender, nondistended, + BS MS: no deformity or atrophy Skin: warm and dry, no rash Neuro:  Strength and sensation are intact Psych: euthymic mood, full affect   EKG:  EKG is ordered today. The ekg ordered today demonstrates sinus rhythm with rate 85 bpm, improving STE in V2-4 from discharge EKG 07/02/19, isolated TWI in aVL., no significant change from previous.   Recent Labs: 07/01/2019: TSH 4.530 07/02/2019: Hemoglobin 13.3; Platelets 181 07/12/2019: BUN 17; Creatinine, Ser 1.15; Potassium 5.9; Sodium 141    Lipid Panel    Component Value Date/Time   CHOL 158 07/01/2019 0343   TRIG 48 07/01/2019 0343   HDL 53 07/01/2019 0343   CHOLHDL 3.0 07/01/2019 0343   VLDL 10 07/01/2019 0343   LDLCALC 95 07/01/2019 0343      Wt Readings from Last 3 Encounters:  07/12/19 259 lb 9.6 oz (117.8 kg)  07/02/19 263 lb 11.2 oz (119.6 kg)  09/17/16 251 lb 12.8 oz (114.2 kg)      Other studies Reviewed: Additional studies/ records that were reviewed today include:   LHC 07/01/19:   Prox RCA to Mid RCA lesion is 60% stenosed.  Prox LAD to Mid LAD lesion is 100% stenosed.  Post  intervention, there is a 0% residual stenosis.  A stent was successfully placed.  Total occlusion of the proximal LAD with initial TIMI 0 flow and faint septal collateralization via the distal RCA.  Normal left circumflex coronary artery.   Dominant RCA with 60% mid RCA stenosis..  Low normal to mildly impaired LV function with EF estimate at 50%; LVEDP 16 mmHg.  PCI to the LAD with ultimate insertion of a 3.0 x 26 mm Resolute Onyx DES stent postdilated to 3.25 mm with the 100% occlusion being reduced to 0% and TIMI 0 flow being improved to TIMI-3 flow.  RECOMMENDATION:  DAPT for minimum of 1 year. Smoking cessation is essential. Optimal blood pressure control with target blood pressure less than 130/80 and ideal less than 120/80. Aggressive lipid-lowering therapy with LDL goal less than 70.    Echocardiogram 07/01/19:  1. Left ventricular ejection fraction, by estimation, is 35 to 40%. The  left ventricle has moderately decreased function. The left ventricle  demonstrates regional wall motion abnormalities (see scoring  diagram/findings for description). The left  ventricular internal cavity size was mildly dilated. Left ventricular  diastolic parameters are consistent with Grade I diastolic dysfunction  (impaired relaxation). Elevated left atrial pressure.  2. Right ventricular systolic function is normal. The right ventricular  size is normal.  3. The mitral valve is normal in structure. No evidence of mitral valve  regurgitation. No evidence of mitral stenosis.  4. The aortic valve is normal in structure. Aortic valve regurgitation is  not visualized. No aortic stenosis is present.  5. The inferior vena cava is normal in size with greater than 50%  respiratory variability, suggesting right atrial pressure of 3 mmHg.     ASSESSMENT AND PLAN:  1. CAD s/p PCI/DES to LAD 07/01/19: no recurrent chest pain. He has been compliant with his aspirin and brilinta -  Continue aspirin and brilinta - Continue atorvastatin and zetia - Continue metoprolol  2. Ischemic cardiomyopathy: EF 35-40% with RWMA. He was discharged home on metoprolol tartrate 12.5mg  BID (with plans to consolidate outpatient if tolerated) and losartan  12.5mg  daily.  - Will consolidate metoprolol to succinate 50mg  daily given elevated blood pressures - Will transition to entresto 24-26mg  daily with plans to uptitrate as BP will allow. Will have him f/u with pharmacy in 2 weeks for med titration - Plan to repeat an echo in mid July to evaluate for improvement in EF  3. HTN: BP 146/90 today - Managed in the context of #2  4. HLD: LDL 95 07/01/19. Crestor transitioned to atorvastatin during admission - Continue atorvastatin 80mg  daily and zetia 10mg  daily - Plan to repeat FLP in 6 weeks - if not at goal of LDL<70, will consider referral to lipid clinic for PSK9-I consideration.   5. DM type 2: A1C 7.0 - Continue insulin and januvia per PCP - Consider jardiance if A1C remains above goal   6. Tobacco abuse: still smoking. Knows he needs to quit and is hopeful to do so - Continue to encourage smoking cessation.      Current medicines are reviewed at length with the patient today.  The patient does not have concerns regarding medicines.  The following changes have been made:  As above  Labs/ tests ordered today include:   Orders Placed This Encounter  Procedures  . Basic metabolic panel  . Lipid panel  . EKG 12-Lead  . ECHOCARDIOGRAM COMPLETE     Disposition:   FU with pharmacy in 2 weeks for med titration and Dr. 07/03/19 in 3 months  Signed, , PA-C  07/13/2019 11:36 PM

## 2019-07-12 ENCOUNTER — Other Ambulatory Visit: Payer: Self-pay

## 2019-07-12 ENCOUNTER — Ambulatory Visit: Payer: Federal, State, Local not specified - PPO | Admitting: Medical

## 2019-07-12 ENCOUNTER — Encounter: Payer: Self-pay | Admitting: Medical

## 2019-07-12 VITALS — BP 146/90 | HR 85 | Temp 97.0°F | Ht 72.0 in | Wt 259.6 lb

## 2019-07-12 DIAGNOSIS — E782 Mixed hyperlipidemia: Secondary | ICD-10-CM | POA: Diagnosis not present

## 2019-07-12 DIAGNOSIS — I10 Essential (primary) hypertension: Secondary | ICD-10-CM | POA: Diagnosis not present

## 2019-07-12 DIAGNOSIS — I2510 Atherosclerotic heart disease of native coronary artery without angina pectoris: Secondary | ICD-10-CM

## 2019-07-12 DIAGNOSIS — I255 Ischemic cardiomyopathy: Secondary | ICD-10-CM | POA: Diagnosis not present

## 2019-07-12 DIAGNOSIS — Z720 Tobacco use: Secondary | ICD-10-CM

## 2019-07-12 DIAGNOSIS — E119 Type 2 diabetes mellitus without complications: Secondary | ICD-10-CM

## 2019-07-12 DIAGNOSIS — I5042 Chronic combined systolic (congestive) and diastolic (congestive) heart failure: Secondary | ICD-10-CM

## 2019-07-12 DIAGNOSIS — Z794 Long term (current) use of insulin: Secondary | ICD-10-CM

## 2019-07-12 MED ORDER — ENTRESTO 24-26 MG PO TABS: 1.0000 | ORAL_TABLET | Freq: Two times a day (BID) | ORAL | 0 refills | Status: DC

## 2019-07-12 MED ORDER — METOPROLOL SUCCINATE ER 50 MG PO TB24: 50.0000 mg | ORAL_TABLET | Freq: Every day | ORAL | 3 refills | Status: DC

## 2019-07-12 NOTE — Patient Instructions (Addendum)
Medication Instructions:   STOP LOSARTAN  STOP METOPROLOL TARTRATE  START ENTRESTO 24-26 MG DAILY  START METOPROLOL SUCCINATE 50 MG DAILY  *If you need a refill on your cardiac medications before your next appointment, please call your pharmacy*  Lab Work: Your physician recommends that you return for lab work TODAY:   BMET  Your physician recommends that you return for a FASTING lipid profile in 6 weeks-08/23/2019: DO NOT EAT OR DRINK PAST MIDNIGHT. (OK TO HAVE WATER)  If you have labs (blood work) drawn today and your tests are completely normal, you will receive your results only by: Marland Kitchen MyChart Message (if you have MyChart) OR . A paper copy in the mail If you have any lab test that is abnormal or we need to change your treatment, we will call you to review the results.  Testing/Procedures: Your physician has requested that you have an echocardiogram. Echocardiography is a painless test that uses sound waves to create images of your heart. It provides your doctor with information about the size and shape of your heart and how well your heart's chambers and valves are working. This procedure takes approximately one hour. There are no restrictions for this procedure.   PLEASE SCHEDULE FOR MID July 2021  Follow-Up: At [facility], you and your health needs are our priority.  As part of our continuing mission to provide you with exceptional heart care, we have created designated Provider Care Teams.  These Care Teams include your primary Cardiologist (physician) and Advanced Practice Providers (APPs -  Physician Assistants and Nurse Practitioners) who all work together to provide you with the care you need, when you need it.   Your next appointment:   2 week(s) AFTER ECHOCARDIOGRAM   The format for your next appointment:   In Person In Person  Provider:   [facility] Jodelle Red, MD  Other Instructions

## 2019-07-13 ENCOUNTER — Telehealth: Payer: Self-pay | Admitting: Cardiovascular Disease

## 2019-07-13 ENCOUNTER — Encounter: Payer: Self-pay | Admitting: Medical

## 2019-07-13 LAB — BASIC METABOLIC PANEL
BUN/Creatinine Ratio: 15 (ref 10–24)
BUN: 17 mg/dL (ref 8–27)
CO2: 22 mmol/L (ref 20–29)
Calcium: 10.3 mg/dL — ABNORMAL HIGH (ref 8.6–10.2)
Chloride: 102 mmol/L (ref 96–106)
Creatinine, Ser: 1.15 mg/dL (ref 0.76–1.27)
GFR calc Af Amer: 78 mL/min/{1.73_m2} (ref >59)
GFR calc non Af Amer: 68 mL/min/{1.73_m2} (ref >59)
Glucose: 129 mg/dL — ABNORMAL HIGH (ref 65–99)
Potassium: 5.9 mmol/L (ref 3.5–5.2)
Sodium: 141 mmol/L (ref 134–144)

## 2019-07-13 NOTE — Telephone Encounter (Signed)
Call from [facility]:  Reference #: [id number]  Serum potassium = 5.9   Donnald Garre, MD Cardiology overnight coverage

## 2019-07-15 ENCOUNTER — Other Ambulatory Visit: Payer: Self-pay | Admitting: Medical

## 2019-07-15 ENCOUNTER — Other Ambulatory Visit: Payer: Self-pay | Admitting: *Deleted

## 2019-07-15 DIAGNOSIS — I5042 Chronic combined systolic (congestive) and diastolic (congestive) heart failure: Secondary | ICD-10-CM

## 2019-07-15 DIAGNOSIS — E875 Hyperkalemia: Secondary | ICD-10-CM

## 2019-07-15 NOTE — Progress Notes (Signed)
   Labs 07/13/19 with K 5.9. Curious what caused this as the patient was on losartan-HCTZ prior to recent hospitalization and continued afterwards given newly diagnosed LV systolic dysfunction, though without HCTZ. He was transitioned to entresto at his visit 07/13/19. Patient was contacted 07/14/19 and instructed to hold this medication with plans to repeat a BMET 07/16/19. Patient was instructed to monitor his blood pressure closely in the interim.   Discussed with Dr. Cristal Deer. Could consider restarting entresto if K normalizes with consideration to add low dose HCTZ or chlorthalidone. Will await repeat BMET results.  Beatriz Stallion, PA-C 07/15/19; 12:34 PM

## 2019-07-16 ENCOUNTER — Other Ambulatory Visit: Payer: Self-pay

## 2019-07-16 DIAGNOSIS — E875 Hyperkalemia: Secondary | ICD-10-CM

## 2019-07-19 DIAGNOSIS — E875 Hyperkalemia: Secondary | ICD-10-CM | POA: Diagnosis not present

## 2019-07-20 LAB — BASIC METABOLIC PANEL
BUN/Creatinine Ratio: 15 (ref 10–24)
BUN: 17 mg/dL (ref 8–27)
CO2: 20 mmol/L (ref 20–29)
Calcium: 10.2 mg/dL (ref 8.6–10.2)
Chloride: 102 mmol/L (ref 96–106)
Creatinine, Ser: 1.12 mg/dL (ref 0.76–1.27)
GFR calc Af Amer: 81 mL/min/{1.73_m2} (ref >59)
GFR calc non Af Amer: 70 mL/min/{1.73_m2} (ref >59)
Glucose: 130 mg/dL — ABNORMAL HIGH (ref 65–99)
Potassium: 5.6 mmol/L — ABNORMAL HIGH (ref 3.5–5.2)
Sodium: 141 mmol/L (ref 134–144)

## 2019-07-20 MED ORDER — CARVEDILOL 12.5 MG PO TABS
12.5000 mg | ORAL_TABLET | Freq: Two times a day (BID) | ORAL | 6 refills | Status: DC
Start: 2019-07-20 — End: 2020-01-17

## 2019-07-20 NOTE — Addendum Note (Signed)
Addended by: Judy Pimple on: 07/20/2019 03:00 PM   Modules accepted: Orders

## 2019-07-21 DIAGNOSIS — I10 Essential (primary) hypertension: Secondary | ICD-10-CM | POA: Diagnosis not present

## 2019-07-21 DIAGNOSIS — E1142 Type 2 diabetes mellitus with diabetic polyneuropathy: Secondary | ICD-10-CM | POA: Diagnosis not present

## 2019-07-21 DIAGNOSIS — I2102 ST elevation (STEMI) myocardial infarction involving left anterior descending coronary artery: Secondary | ICD-10-CM | POA: Diagnosis not present

## 2019-07-21 DIAGNOSIS — F17210 Nicotine dependence, cigarettes, uncomplicated: Secondary | ICD-10-CM | POA: Diagnosis not present

## 2019-07-26 ENCOUNTER — Other Ambulatory Visit: Payer: Self-pay

## 2019-07-26 DIAGNOSIS — E875 Hyperkalemia: Secondary | ICD-10-CM | POA: Diagnosis not present

## 2019-07-27 LAB — BASIC METABOLIC PANEL
BUN/Creatinine Ratio: 14 (ref 10–24)
BUN: 16 mg/dL (ref 8–27)
CO2: 23 mmol/L (ref 20–29)
Calcium: 9.6 mg/dL (ref 8.6–10.2)
Chloride: 99 mmol/L (ref 96–106)
Creatinine, Ser: 1.15 mg/dL (ref 0.76–1.27)
GFR calc Af Amer: 78 mL/min/{1.73_m2} (ref >59)
GFR calc non Af Amer: 68 mL/min/{1.73_m2} (ref >59)
Glucose: 143 mg/dL — ABNORMAL HIGH (ref 65–99)
Potassium: 4.9 mmol/L (ref 3.5–5.2)
Sodium: 137 mmol/L (ref 134–144)

## 2019-07-29 ENCOUNTER — Other Ambulatory Visit: Payer: Self-pay

## 2019-07-29 ENCOUNTER — Ambulatory Visit (INDEPENDENT_AMBULATORY_CARE_PROVIDER_SITE_OTHER): Payer: Federal, State, Local not specified - PPO | Admitting: Pharmacist Clinician (PhC)/ Clinical Pharmacy Specialist

## 2019-07-29 VITALS — BP 158/92 | Temp 98.3°F | Ht 72.0 in | Wt 261.8 lb

## 2019-07-29 DIAGNOSIS — I10 Essential (primary) hypertension: Secondary | ICD-10-CM

## 2019-07-29 DIAGNOSIS — E875 Hyperkalemia: Secondary | ICD-10-CM

## 2019-07-29 DIAGNOSIS — Z720 Tobacco use: Secondary | ICD-10-CM | POA: Diagnosis not present

## 2019-07-29 DIAGNOSIS — E785 Hyperlipidemia, unspecified: Secondary | ICD-10-CM | POA: Diagnosis not present

## 2019-07-29 MED ORDER — ROSUVASTATIN CALCIUM 5 MG PO TABS
ORAL_TABLET | ORAL | 3 refills | Status: DC
Start: 2019-07-29 — End: 2021-02-21

## 2019-07-29 MED ORDER — HYDROCHLOROTHIAZIDE 12.5 MG PO CAPS
12.5000 mg | ORAL_CAPSULE | Freq: Every day | ORAL | 3 refills | Status: DC
Start: 2019-07-29 — End: 2020-01-31

## 2019-07-29 NOTE — Progress Notes (Signed)
07/30/2019 Gabriel Bean Jul 24, 1955 [id number]   HPI:  Gabriel Bean is a 64 y.o. male patient of Gabriel Bean, with a Gabriel Bean below who presents today for heart failure medication titration.   Patient has HFrEF with his most recent EF at 35-40%.  She had him discontinue losartan and start Entresto 24/26.  He went to the lab the same day, and unfortunately his potassium cam back at 5.9.  He was contacted and told to hold off on the Gabriel Bean.  Repeat lab 1 week later showed potassium still elevated at 5.6.  Finally down to 4.9 on May 10.  A note in Epic from Gabriel Bean notes that in reviewing with Gabriel. Harrell Bean, consider putting patient back on hctz as a potential to keep potassium levels from rising.  (patient had been on losartan/hctz prior to hospitalization)  Today he is in the office, originally scheduled for titration of Entresto.  He complains of continued DOE, noted that he tried to plant 4 peppers in his garden and found he had to rest between each.  Cannot walk to end of driveway without pause either.  He has also noted muscle aches in both his hands and hips since discharge from the hospital.  Prior to admit he had been on rosuvastatin 5 mg once weekly.  After discharge was placed on atorvastatin 80 mg.  He is an insulin dependent diabetic, using an Omnipod pump.  He keeps the pump on his upper right arm and his blood sugar sensor on the left.  This prevents him from doing blood pressure measurements on his upper arms.  Has been checking on his forearm instead.    Past Medical History: ASCVD NSTEMI - s/p DES to LAD 4/21 - on Brilinta and aspirin  hyperlipidemia On atorvastatin 80 and ezetimibe 10  DM2 A1c 7.0 on insulin pump and Januvia  Tobacco abuse Now < 1 ppd     Blood Pressure Goal:  130/80  Current Medications:   carvedilol 12.5 mg bid  Family Hx: father with hypertension all his life, hard to control, CHF, died at 70;  Mother died at 60 - healthy to end MI in her  sleep;  Brother deceased at 17 with "hole in heart" had heart/lung trasplant and died after procedure;  No children  Social Hx: carton every 2 weeks, trying to wean off, down from 1 carton/wk recently;  drinks 10 beers/day plus double burbon; coffee (2) in the mornings, some Mt. Dew and Coke (both sugar free)  Diet: chicken, beef, gets grab and go dinners at Gabriel Bean most days:   Exercise: no regular exercise, likes to work around house,   Home BP readings: thinks machine not accurate - home systolic readings 151'V mostly  Intolerances: nkda  Labs: 07/26/19:  Na 137, K 4.9, Glu 143, BUN 16, SCr 1.15  Wt Readings from Last 3 Encounters:  07/29/19 261 lb 12.8 oz (118.8 kg)  07/12/19 259 lb 9.6 oz (117.8 kg)  07/02/19 263 lb 11.2 oz (119.6 kg)   BP Readings from Last 3 Encounters:  07/29/19 (!) 158/92  07/12/19 (!) 146/90  07/02/19 112/74   Pulse Readings from Last 3 Encounters:  07/12/19 85  07/02/19 92  09/17/16 76    Current Outpatient Medications  Medication Sig Dispense Refill  . atorvastatin (LIPITOR) 80 MG tablet Take 1 tablet (80 mg total) by mouth daily. 60 tablet 3  . carvedilol (COREG) 12.5 MG tablet Take 1 tablet (12.5 mg total) by  mouth 2 (two) times daily. 60 tablet 6  . allopurinol (ZYLOPRIM) 300 MG tablet Take 300 mg by mouth daily.    Marland Kitchen aspirin 81 MG chewable tablet Chew 1 tablet (81 mg total) by mouth daily.    Marland Kitchen buPROPion (WELLBUTRIN XL) 300 MG 24 hr tablet Take 300 mg by mouth daily.     . Continuous Blood Gluc Sensor (FREESTYLE LIBRE 14 DAY SENSOR) MISC SMARTSIG:1 Each Topical Every 2 Weeks    . DULoxetine (CYMBALTA) 60 MG capsule Take 120 mg by mouth daily.    Marland Kitchen ezetimibe (ZETIA) 10 MG tablet Take 10 mg by mouth daily.    . hydrochlorothiazide (MICROZIDE) 12.5 MG capsule Take 1 capsule (12.5 mg total) by mouth daily. 90 capsule 3  . Insulin Human (INSULIN PUMP) SOLN Inject into the skin continuous. novolog    . JANUVIA 100 MG tablet Take 100 mg by  mouth daily.  5  . NOVOLOG 100 UNIT/ML injection SMARTSIG:100 Unit(s) SUB-Q Daily    . rosuvastatin (CRESTOR) 5 MG tablet Take 1 tablet 3-5 times per week as tolerated 90 tablet 3  . ticagrelor (BRILINTA) 90 MG TABS tablet Take 1 tablet (90 mg total) by mouth 2 (two) times daily. 120 tablet 3   No current facility-administered medications for this visit.    No Known Allergies  Past Medical History:  Diagnosis Date  . Chronic rhinitis   . Coronary artery disease   . GERD (gastroesophageal reflux disease)   . History of skin cancer   . Hypertension   . Low libido   . OCD (obsessive compulsive disorder)   . OSA (obstructive sleep apnea)   . Seasonal allergies     Blood pressure (!) 158/92, temperature 98.3 F (36.8 C), height 6' (1.829 m), weight 261 lb 12.8 oz (118.8 kg).  HTN (hypertension) Patient with HFrEF, has not yet started Entresto due to elevated potassium levels.  Previously was fine with losartan hctz combination.  Will have patient start Entresto 24/26 mg bid and hctz 12.5 mg (in hopes of keeping potassium from rising).  He will need to repeat BMET next week to monitor this closely.  We will see him back in 2 weeks to determine if able to up-titrate this or the carvedilol.  He was asked to monitor his home BP once or twice daily and bring a list of those readings, plus his home meter to the next appointment.    Tobacco abuse Patient had decided to quit by tapering off cigarettes.   Now down to a carton every 2 weeks (just under 3.4 ppd).  Does not have a quit date, but is just self-tapering.    Hyperlipidemia LDL goal <70 Patient with ASCVD and LDL at 95 on rosuvastatin 5 mg weekly at time of NSTEMI last month.  Discharged on atorvastatin 80 mg daily.  Has noted myalgias in hands and hips, unsure if related to increased statin dose.  Will have him discontinue atorvastatin for now and if he still has some, restart on the rosuvastatin 5 mg twice weekly.  When he returns for  Entresto titration we can evaluate any changes in myalgias and get him on highest tolerated statin at that time.   Phillips Bean PharmD CPP [facility] [address] [place], [place] [address] [phone]

## 2019-07-29 NOTE — Patient Instructions (Addendum)
Return for a a follow up appointment in 2 weeks  Go to the lab in 1 week to check potassium level.    Check your blood pressure at home daily (if able) and keep record of the readings.  Take your BP meds as follows:   Start Entresto 24/26 mg twice daily  Stop atorvastatin.  Restart rosuvastatin 5 mg (twice weekly instead of once weekly) if you still have some at home.   Bring all of your meds, your BP cuff and your record of home blood pressures to your next appointment.  Exercise as you're able, try to walk approximately 30 minutes per day.  Keep salt intake to a minimum, especially watch canned and prepared boxed foods.  Eat more fresh fruits and vegetables and fewer canned items.  Avoid eating in fast food restaurants.    HOW TO TAKE YOUR BLOOD PRESSURE: . Rest 5 minutes before taking your blood pressure. .  Don't smoke or drink caffeinated beverages for at least 30 minutes before. . Take your blood pressure before (not after) you eat. . Sit comfortably with your back supported and both feet on the floor (don't cross your legs). . Elevate your arm to heart level on a table or a desk. . Use the proper sized cuff. It should fit smoothly and snugly around your bare upper arm. There should be enough room to slip a fingertip under the cuff. The bottom edge of the cuff should be 1 inch above the crease of the elbow. . Ideally, take 3 measurements at one sitting and record the average.

## 2019-07-30 ENCOUNTER — Encounter: Payer: Self-pay | Admitting: Pharmacist Clinician (PhC)/ Clinical Pharmacy Specialist

## 2019-07-30 DIAGNOSIS — E782 Mixed hyperlipidemia: Secondary | ICD-10-CM | POA: Insufficient documentation

## 2019-07-30 DIAGNOSIS — E785 Hyperlipidemia, unspecified: Secondary | ICD-10-CM | POA: Insufficient documentation

## 2019-07-30 NOTE — Assessment & Plan Note (Signed)
Patient with HFrEF, has not yet started Entresto due to elevated potassium levels.  Previously was fine with losartan hctz combination.  Will have patient start Entresto 24/26 mg bid and hctz 12.5 mg (in hopes of keeping potassium from rising).  He will need to repeat BMET next week to monitor this closely.  We will see him back in 2 weeks to determine if able to up-titrate this or the carvedilol.  He was asked to monitor his home BP once or twice daily and bring a list of those readings, plus his home meter to the next appointment.

## 2019-07-30 NOTE — Assessment & Plan Note (Signed)
Patient had decided to quit by tapering off cigarettes.   Now down to a carton every 2 weeks (just under 3.4 ppd).  Does not have a quit date, but is just self-tapering.

## 2019-07-30 NOTE — Assessment & Plan Note (Signed)
Patient with ASCVD and LDL at 95 on rosuvastatin 5 mg weekly at time of NSTEMI last month.  Discharged on atorvastatin 80 mg daily.  Has noted myalgias in hands and hips, unsure if related to increased statin dose.  Will have him discontinue atorvastatin for now and if he still has some, restart on the rosuvastatin 5 mg twice weekly.  When he returns for Entresto titration we can evaluate any changes in myalgias and get him on highest tolerated statin at that time.

## 2019-08-03 DIAGNOSIS — E7800 Pure hypercholesterolemia, unspecified: Secondary | ICD-10-CM | POA: Diagnosis not present

## 2019-08-03 DIAGNOSIS — I10 Essential (primary) hypertension: Secondary | ICD-10-CM | POA: Diagnosis not present

## 2019-08-03 DIAGNOSIS — E1165 Type 2 diabetes mellitus with hyperglycemia: Secondary | ICD-10-CM | POA: Diagnosis not present

## 2019-08-04 DIAGNOSIS — G4733 Obstructive sleep apnea (adult) (pediatric): Secondary | ICD-10-CM | POA: Diagnosis not present

## 2019-08-05 ENCOUNTER — Telehealth: Payer: Self-pay | Admitting: Cardiology

## 2019-08-05 NOTE — Telephone Encounter (Signed)
Lm for the pt to call back.

## 2019-08-05 NOTE — Telephone Encounter (Signed)
Pt c/o Shortness Of Breath: STAT if SOB developed within the last 24 hours or pt is noticeably SOB on the phone  1. Are you currently SOB (can you hear that pt is SOB on the phone)? Yes sounded SOB on the phone  2. How long have you been experiencing SOB? Been going on for a while but is getting worse. She was told by the patient that his heart is only 30% working   3. Are you SOB when sitting or when up moving around? Both   4. Are you currently experiencing any other symptoms? Denies any other symptoms. Nurse states that the patient is taking Entresto and Brilinta but from her understanding he is not supposed to be taking Entresto anymore. Unsure if this is the cause of his SOB. Virgina Norfolk is the nurse with [employer] and she states that the patient just did not sound like he was doing good at all. She states he is confused about his medications. Please contact patient in regards to this, his phone number is [phone]

## 2019-08-06 MED ORDER — ENTRESTO 24-26 MG PO TABS: 1.0000 | ORAL_TABLET | Freq: Two times a day (BID) | ORAL | 0 refills | Status: DC

## 2019-08-06 NOTE — Telephone Encounter (Signed)
Medication list updated to reflect Entresto 24/26mg . We usually provide samples during titration prior. Talked to Mr Champney today and he has sampled available for at least 5 more days. Next f/u appointment scheduled for 08/17/2019.   Patient will repeat BMET in Monday 5/24 to determined if tolerating then will follow up with pharmacist. Sample provided to last until next office visit.    Drug name: Sherryll Burger      Strength: 24/26mg         Qty: 28 tabs  LOT: [id number] Exp.Date: May/2022

## 2019-08-06 NOTE — Telephone Encounter (Signed)
Spoke with pt. He is supposed to be taking Entresto 24/26 per his last visit note with Teresita Madura, RPH. He states he has samples and has not missed a day. He was denied a refill when he requested it. I advised him I saw the note and that he was placed back on Entresto 24/26 but it was currently not on his medication list.   Pt also needs a co-pay assistance card.   I will forward to the pharmacist to place the order since there is no current order.   He will be in on Monday for lab work.

## 2019-08-06 NOTE — Telephone Encounter (Signed)
LVM for return call. 

## 2019-08-09 DIAGNOSIS — I5042 Chronic combined systolic (congestive) and diastolic (congestive) heart failure: Secondary | ICD-10-CM | POA: Diagnosis not present

## 2019-08-10 DIAGNOSIS — E7800 Pure hypercholesterolemia, unspecified: Secondary | ICD-10-CM | POA: Diagnosis not present

## 2019-08-10 DIAGNOSIS — E1165 Type 2 diabetes mellitus with hyperglycemia: Secondary | ICD-10-CM | POA: Diagnosis not present

## 2019-08-10 DIAGNOSIS — Z9641 Presence of insulin pump (external) (internal): Secondary | ICD-10-CM | POA: Diagnosis not present

## 2019-08-10 DIAGNOSIS — I10 Essential (primary) hypertension: Secondary | ICD-10-CM | POA: Diagnosis not present

## 2019-08-10 LAB — BASIC METABOLIC PANEL
BUN/Creatinine Ratio: 16 (ref 10–24)
BUN: 18 mg/dL (ref 8–27)
CO2: 24 mmol/L (ref 20–29)
Calcium: 9.5 mg/dL (ref 8.6–10.2)
Chloride: 101 mmol/L (ref 96–106)
Creatinine, Ser: 1.15 mg/dL (ref 0.76–1.27)
GFR calc Af Amer: 78 mL/min/{1.73_m2} (ref >59)
GFR calc non Af Amer: 68 mL/min/{1.73_m2} (ref >59)
Glucose: 151 mg/dL — ABNORMAL HIGH (ref 65–99)
Potassium: 4.4 mmol/L (ref 3.5–5.2)
Sodium: 138 mmol/L (ref 134–144)

## 2019-08-17 ENCOUNTER — Ambulatory Visit (INDEPENDENT_AMBULATORY_CARE_PROVIDER_SITE_OTHER): Payer: Federal, State, Local not specified - PPO | Admitting: Pharmacist Clinician (PhC)/ Clinical Pharmacy Specialist

## 2019-08-17 ENCOUNTER — Other Ambulatory Visit: Payer: Self-pay

## 2019-08-17 DIAGNOSIS — I5020 Unspecified systolic (congestive) heart failure: Secondary | ICD-10-CM

## 2019-08-17 MED ORDER — ENTRESTO 24-26 MG PO TABS: 1.0000 | ORAL_TABLET | Freq: Two times a day (BID) | ORAL | 6 refills | Status: DC

## 2019-08-17 NOTE — Progress Notes (Signed)
08/19/2019 Gabriel Bean Dec 17, 1955 [id number]   HPI:  Gabriel Bean is a 64 y.o. male patient of Dr Cristal Deer, with a PMH below who presents today for heart failure medication titration.   Patient has HFrEF with his most recent EF at 35-40%.  She had him discontinue losartan and start Entresto 24/26.  He went to the lab the same day, and unfortunately his potassium cam back at 5.9.  He was contacted and told to hold off on the [place].  Repeat lab 1 week later showed potassium still elevated at 5.6.  Finally down to 4.9 on May 10.  A note in Epic from [employer] notes that in reviewing with Dr. Cristal Deer, consider putting patient back on hctz as a potential to keep potassium levels from rising.  (patient had been on losartan/hctz prior to hospitalization).  We saw him in [facility] two weeks ago we re-started the Entresto 24/26 bid, as well as hctz 12.5 mg qd.  Metabolic panel drawn 2 weeks later showed potassium stable at 4.4.  Home BP readings, as well as office, are done on his forearm, as he has an insulin pump on one arm and sensor on the other.   He returns today for follow up.  Other than noticing some dizziness with positional changes, he has no complaints.  Home BP readings are interesting in that morning readings show elevated diastolic average but evenings are 25 points lower on average.    Past Medical History: ASCVD NSTEMI - s/p DES to LAD 4/21 - on Brilinta and aspirin  hyperlipidemia On atorvastatin 80 and ezetimibe 10  DM2 A1c 7.0 on insulin pump and Januvia  Tobacco abuse Now < 1 ppd     Blood Pressure Goal:  130/80  Current Medications:   carvedilol 12.5 mg bid, Entresto 24/26 bid,   Family Hx: father with hypertension all his life, hard to control, CHF, died at 82;  Mother died at 53 - healthy to end MI in her sleep;  Brother deceased at 18 with "hole in heart" had heart/lung trasplant and died after procedure;  No children  Social Hx: carton every 2 weeks,  trying to wean off, down from 1 carton/wk recently;  drinks 10 beers/day plus double burbon; coffee (2) in the mornings, some Mt. Dew and Coke (both sugar free)  Diet: chicken, beef, gets grab and go dinners at [employer] [place] [address] most days:   Exercise: no regular exercise, likes to work around house,   Home BP readings: brings list of home readings today, home cuff not validated in our office at this time.  Morning average (17 readings) 126/90 HR 95, Evening average (9 readings) 117/76 HF 105,    Intolerances: nkda  Labs: 07/26/19:  Na 137, K 4.9, Glu 143, BUN 16, SCr 1.15  Wt Readings from Last 3 Encounters:  07/29/19 261 lb 12.8 oz (118.8 kg)  07/12/19 259 lb 9.6 oz (117.8 kg)  07/02/19 263 lb 11.2 oz (119.6 kg)   BP Readings from Last 3 Encounters:  08/17/19 106/72  07/29/19 (!) 158/92  07/12/19 (!) 146/90   Pulse Readings from Last 3 Encounters:  08/17/19 88  07/12/19 85  07/02/19 92    Current Outpatient Medications  Medication Sig Dispense Refill  . allopurinol (ZYLOPRIM) 300 MG tablet Take 300 mg by mouth daily.    Marland Kitchen aspirin 81 MG chewable tablet Chew 1 tablet (81 mg total) by mouth daily.    Marland Kitchen atorvastatin (LIPITOR) 80 MG tablet  Take 1 tablet (80 mg total) by mouth daily. 60 tablet 3  . buPROPion (WELLBUTRIN XL) 300 MG 24 hr tablet Take 300 mg by mouth daily.     . carvedilol (COREG) 12.5 MG tablet Take 1 tablet (12.5 mg total) by mouth 2 (two) times daily. 60 tablet 6  . Continuous Blood Gluc Sensor (FREESTYLE LIBRE 14 DAY SENSOR) MISC SMARTSIG:1 Each Topical Every 2 Weeks    . DULoxetine (CYMBALTA) 60 MG capsule Take 120 mg by mouth daily.    Marland Kitchen ezetimibe (ZETIA) 10 MG tablet Take 10 mg by mouth daily.    . hydrochlorothiazide (MICROZIDE) 12.5 MG capsule Take 1 capsule (12.5 mg total) by mouth daily. 90 capsule 3  . Insulin Human (INSULIN PUMP) SOLN Inject into the skin continuous. novolog    . JANUVIA 100 MG tablet Take 100 mg by mouth daily.  5  . NOVOLOG 100  UNIT/ML injection SMARTSIG:100 Unit(s) SUB-Q Daily    . rosuvastatin (CRESTOR) 5 MG tablet Take 1 tablet 3-5 times per week as tolerated 90 tablet 3  . sacubitril-valsartan (ENTRESTO) 24-26 MG Take 1 tablet by mouth 2 (two) times daily. 30 tablet 6  . ticagrelor (BRILINTA) 90 MG TABS tablet Take 1 tablet (90 mg total) by mouth 2 (two) times daily. 120 tablet 3   No current facility-administered medications for this visit.    No Known Allergies  Past Medical History:  Diagnosis Date  . Chronic rhinitis   . Coronary artery disease   . GERD (gastroesophageal reflux disease)   . History of skin cancer   . Hypertension   . Low libido   . OCD (obsessive compulsive disorder)   . OSA (obstructive sleep apnea)   . Seasonal allergies     Blood pressure 106/72, pulse 88.  Standing pressure 96/64  HFrEF (heart failure with reduced ejection fraction) ([place]) Patient with HFrEF doing well with carvedilol 12.5 and Entresto 24/26 both bid.  His blood pressure is excellent in the office this morning, and did have a 10 point drop upon standing, to < 409 systolic.  Because of this, we will not try increasing dose, but keep him at the current regimen.  He should continue with regular home monitoring 2-3 times each week and has a follow up with Dr. Harrell Gave in August.  He is to call the office should he have any issues getting the medication covered by insurance.     Tommy Medal PharmD CPP [facility] [address] [place], [place] [address] [phone]

## 2019-08-17 NOTE — Patient Instructions (Signed)
Return for a a follow up appointment with Dr. Cristal Deer in August   Check your blood pressure at home several days each week and keep record of the readings.  Take your meds as follows:  Continue with all current medications  Bring all of your meds, your BP cuff and your record of home blood pressures to your next appointment.  Exercise as you're able, try to walk approximately 30 minutes per day.  Keep salt intake to a minimum, especially watch canned and prepared boxed foods.  Eat more fresh fruits and vegetables and fewer canned items.  Avoid eating in fast food restaurants.    HOW TO TAKE YOUR BLOOD PRESSURE: . Rest 5 minutes before taking your blood pressure. .  Don't smoke or drink caffeinated beverages for at least 30 minutes before. . Take your blood pressure before (not after) you eat. . Sit comfortably with your back supported and both feet on the floor (don't cross your legs). . Elevate your arm to heart level on a table or a desk. . Use the proper sized cuff. It should fit smoothly and snugly around your bare upper arm. There should be enough room to slip a fingertip under the cuff. The bottom edge of the cuff should be 1 inch above the crease of the elbow. . Ideally, take 3 measurements at one sitting and record the average.

## 2019-08-18 ENCOUNTER — Encounter: Payer: Self-pay | Admitting: Pharmacist Clinician (PhC)/ Clinical Pharmacy Specialist

## 2019-08-18 DIAGNOSIS — I5020 Unspecified systolic (congestive) heart failure: Secondary | ICD-10-CM | POA: Insufficient documentation

## 2019-08-18 HISTORY — DX: Unspecified systolic (congestive) heart failure: I50.20

## 2019-08-19 NOTE — Assessment & Plan Note (Signed)
Patient with HFrEF doing well with carvedilol 12.5 and Entresto 24/26 both bid.  His blood pressure is excellent in the office this morning, and did have a 10 point drop upon standing, to < 100 systolic.  Because of this, we will not try increasing dose, but keep him at the current regimen.  He should continue with regular home monitoring 2-3 times each week and has a follow up with Dr. Cristal Deer in August.  He is to call the office should he have any issues getting the medication covered by insurance.

## 2019-08-23 ENCOUNTER — Other Ambulatory Visit: Payer: Self-pay

## 2019-08-23 DIAGNOSIS — E782 Mixed hyperlipidemia: Secondary | ICD-10-CM | POA: Diagnosis not present

## 2019-08-24 LAB — BASIC METABOLIC PANEL
BUN/Creatinine Ratio: 15 (ref 10–24)
BUN: 16 mg/dL (ref 8–27)
CO2: 22 mmol/L (ref 20–29)
Calcium: 9.6 mg/dL (ref 8.6–10.2)
Chloride: 103 mmol/L (ref 96–106)
Creatinine, Ser: 1.07 mg/dL (ref 0.76–1.27)
GFR calc Af Amer: 86 mL/min/{1.73_m2} (ref >59)
GFR calc non Af Amer: 74 mL/min/{1.73_m2} (ref >59)
Glucose: 113 mg/dL — ABNORMAL HIGH (ref 65–99)
Potassium: 5.1 mmol/L (ref 3.5–5.2)
Sodium: 140 mmol/L (ref 134–144)

## 2019-08-24 LAB — LIPID PANEL
Chol/HDL Ratio: 2 ratio (ref 0.0–5.0)
Cholesterol, Total: 132 mg/dL (ref 100–199)
HDL: 65 mg/dL (ref >39)
LDL Chol Calc (NIH): 53 mg/dL (ref 0–99)
Triglycerides: 70 mg/dL (ref 0–149)
VLDL Cholesterol Cal: 14 mg/dL (ref 5–40)

## 2019-09-04 DIAGNOSIS — G4733 Obstructive sleep apnea (adult) (pediatric): Secondary | ICD-10-CM | POA: Diagnosis not present

## 2019-09-14 DIAGNOSIS — G4733 Obstructive sleep apnea (adult) (pediatric): Secondary | ICD-10-CM | POA: Diagnosis not present

## 2019-10-05 ENCOUNTER — Telehealth: Payer: Self-pay

## 2019-10-05 ENCOUNTER — Ambulatory Visit (HOSPITAL_COMMUNITY): Payer: Federal, State, Local not specified - PPO | Attending: Medical

## 2019-10-05 ENCOUNTER — Other Ambulatory Visit: Payer: Self-pay

## 2019-10-05 DIAGNOSIS — I5042 Chronic combined systolic (congestive) and diastolic (congestive) heart failure: Secondary | ICD-10-CM

## 2019-10-05 LAB — ECHOCARDIOGRAM COMPLETE
Area-P 1/2: 3.93 cm2
MV M vel: 5.02 m/s
MV Peak grad: 100.8 mmHg
S' Lateral: 3.5 cm

## 2019-10-05 NOTE — Telephone Encounter (Signed)
Patient s/p PCI to LAD on 07/01/19 arrived to clinic for his outpatient follow up echo (last echo EF 35-40%). Echo technician went to lobby to call the patient back and when the patient stood up he became dizzy and started to fall. Echo tech assisted patient to the floor. Patient denies any injury. Patient states that he blacked out for a few seconds. Denies having any other Sx. He states that this happens several times a day and has been going on for years. BP 142/87 and echo tech states that the patient is in SR 74 bpm with occasional PVCs noted on the monitor. Patient is also a diabetic. Patient states that he has not eaten anything. Blood glucose checked and was 130. Last monitor from 2018 showed SR and ST. Made DOD aware of the above information. Will send to primary cardiologist to make aware.

## 2019-10-07 ENCOUNTER — Other Ambulatory Visit: Payer: Self-pay | Admitting: Geriatric Medicine

## 2019-10-07 DIAGNOSIS — F17200 Nicotine dependence, unspecified, uncomplicated: Secondary | ICD-10-CM

## 2019-10-15 ENCOUNTER — Other Ambulatory Visit: Payer: Self-pay | Admitting: Medical

## 2019-10-21 ENCOUNTER — Other Ambulatory Visit: Payer: Self-pay | Admitting: Cardiology

## 2019-10-27 ENCOUNTER — Ambulatory Visit: Payer: Federal, State, Local not specified - PPO | Admitting: Cardiology

## 2019-10-27 ENCOUNTER — Other Ambulatory Visit: Payer: Self-pay

## 2019-10-27 ENCOUNTER — Encounter: Payer: Self-pay | Admitting: Cardiology

## 2019-10-27 VITALS — BP 126/70 | HR 65 | Ht 72.0 in | Wt 248.2 lb

## 2019-10-27 DIAGNOSIS — Z720 Tobacco use: Secondary | ICD-10-CM | POA: Diagnosis not present

## 2019-10-27 DIAGNOSIS — E1143 Type 2 diabetes mellitus with diabetic autonomic (poly)neuropathy: Secondary | ICD-10-CM

## 2019-10-27 DIAGNOSIS — E782 Mixed hyperlipidemia: Secondary | ICD-10-CM

## 2019-10-27 DIAGNOSIS — Z794 Long term (current) use of insulin: Secondary | ICD-10-CM

## 2019-10-27 DIAGNOSIS — I255 Ischemic cardiomyopathy: Secondary | ICD-10-CM | POA: Diagnosis not present

## 2019-10-27 DIAGNOSIS — I2510 Atherosclerotic heart disease of native coronary artery without angina pectoris: Secondary | ICD-10-CM

## 2019-10-27 DIAGNOSIS — I10 Essential (primary) hypertension: Secondary | ICD-10-CM

## 2019-10-27 DIAGNOSIS — Z7189 Other specified counseling: Secondary | ICD-10-CM

## 2019-10-27 NOTE — Progress Notes (Signed)
Cardiology Office Note:    Date:  10/27/2019   ID:  Gabriel Bean, DOB 1956-02-18, MRN [id number]  PCP:  Lajean Manes, MD  Cardiologist:  Buford Dresser, MD  Referring MD: Lajean Manes, MD   Chief Complaint  Patient presents with  . Follow-up    History of Present Illness:    Gabriel Bean is a 64 y.o. male with a hx of CAD with NSTEMI 06/2019, ischemic cardiomyopathy, type II diabetes on insulin pump, hyperlipidemia, hypertension, tobacco use, OSA on CPAP who is seen for follow up today. I met him during his hospitalization for NSTEMI 06/2019.  Today: Rare mild shortness of breath at night, better with taking a few deep breaths. Otherwise doing very well, no chest pain. Able to walk the driveway, bring in trash cans, etc.   ROS positive for lightheadedness 3-4 times/day. This has been going on for years. Has full syncope every 2-3 days, most of the time he can avert it. Always has prodrome, never without warning. Always with standing up or changing position. Wakes right up within a second, no residual symptoms. Has worn a heart monitor in the past, seen by Dr. Curt Bears in 2017.  Has neuropathy from his diabetes. We discussed autonomic dysfunction today.  Has OSA, tries to use CPAP as much as he can but struggles with this. Doesn't sleep well. Checks BP at home, runs 120s/70s usually.   We reviewed echo, EF improved from 35-40% to 50-55%. Also reviewed lipids, LDL now 53. Taking crestor MWF. Still smoking, but down to <1 ppd.   Denies chest pain, shortness of breath at rest or with normal exertion. No PND, orthopnea, LE edema or unexpected weight gain. No palpitations.  Past Medical History:  Diagnosis Date  . Chronic rhinitis   . Coronary artery disease   . GERD (gastroesophageal reflux disease)   . History of skin cancer   . Hypertension   . Low libido   . OCD (obsessive compulsive disorder)   . OSA (obstructive sleep apnea)   . Seasonal allergies     Past  Surgical History:  Procedure Laterality Date  . APPENDECTOMY    . COLONOSCOPY     x3  . CORONARY STENT INTERVENTION N/A 07/01/2019   Procedure: CORONARY STENT INTERVENTION;  Surgeon: Troy Sine, MD;  Location: [facility];  Service: Cardiovascular;  Laterality: N/A;  LAD  . LEFT HEART CATH AND CORONARY ANGIOGRAPHY N/A 07/01/2019   Procedure: LEFT HEART CATH AND CORONARY ANGIOGRAPHY;  Surgeon: Troy Sine, MD;  Location: [facility];  Service: Cardiovascular;  Laterality: N/A;  . SKIN CANCER EXCISION    . TONSILLECTOMY    . WISDOM TOOTH EXTRACTION      Current Medications: Current Outpatient Medications on File Prior to Visit  Medication Sig  . allopurinol (ZYLOPRIM) 300 MG tablet Take 300 mg by mouth daily.  Marland Kitchen aspirin 81 MG chewable tablet Chew 1 tablet (81 mg total) by mouth daily.  Marland Kitchen atorvastatin (LIPITOR) 80 MG tablet Take 1 tablet (80 mg total) by mouth daily.  Marland Kitchen buPROPion (WELLBUTRIN XL) 300 MG 24 hr tablet Take 300 mg by mouth daily.   . carvedilol (COREG) 12.5 MG tablet Take 1 tablet (12.5 mg total) by mouth 2 (two) times daily.  . Continuous Blood Gluc Sensor (FREESTYLE LIBRE 14 DAY SENSOR) MISC SMARTSIG:1 Each Topical Every 2 Weeks  . DULoxetine (CYMBALTA) 60 MG capsule Take 120 mg by mouth daily.  Marland Kitchen ENTRESTO 24-26 MG TAKE  1 TABLET BY MOUTH TWICE DAILY  . ezetimibe (ZETIA) 10 MG tablet Take 10 mg by mouth daily.  . hydrochlorothiazide (MICROZIDE) 12.5 MG capsule Take 1 capsule (12.5 mg total) by mouth daily.  . Insulin Human (INSULIN PUMP) SOLN Inject into the skin continuous. novolog  . JANUVIA 100 MG tablet Take 100 mg by mouth daily.  Marland Kitchen NOVOLOG 100 UNIT/ML injection SMARTSIG:100 Unit(s) SUB-Q Daily  . rosuvastatin (CRESTOR) 5 MG tablet Take 1 tablet 3-5 times per week as tolerated  . ticagrelor (BRILINTA) 90 MG TABS tablet Take 1 tablet (90 mg total) by mouth 2 (two) times daily.   No current facility-administered medications on file prior to visit.      Allergies:   Patient has no known allergies.   Social History   Tobacco Use  . Smoking status: Current Every Day Smoker    Packs/day: 1.00    Types: Cigarettes  . Smokeless tobacco: Current User    Types: Snuff  . Tobacco comment: started smoking at age 33.    Vaping Use  . Vaping Use: Never used  Substance Use Topics  . Alcohol use: Yes    Comment: 5 drinks per day  . Drug use: No    Family History: family history includes Cancer in his father; Colon cancer in his father; Heart disease in his brother and father; Other in his mother; Skin cancer in his father.  ROS:   Please see the history of present illness.  Additional pertinent ROS otherwise unremarkable.  EKGs/Labs/Other Studies Reviewed:    The following studies were reviewed today: [facility] 2019-07-24:   Prox RCA to Mid RCA lesion is 60% stenosed.  Prox LAD to Mid LAD lesion is 100% stenosed.  Post intervention, there is a 0% residual stenosis.  A stent was successfully placed.  Total occlusion of the proximal LAD with initial TIMI 0 flow and faint septal collateralization via the distal RCA.  Normal left circumflex coronary artery.   Dominant RCA with 60% mid RCA stenosis..  Low normal to mildly impaired LV function with EF estimate at 50%; LVEDP 16 mmHg.  PCI to the LAD with ultimate insertion of a 3.0 x 26 mm Resolute Onyx DES stent postdilated to 3.25 mm with the 100% occlusion being reduced to 0% and TIMI 0 flow being improved to TIMI-3 flow.  RECOMMENDATION: DAPT for minimum of 1 year. Smoking cessation is essential. Optimal blood pressure control with target blood pressure less than 130/80 and ideal less than 120/80. Aggressive lipid-lowering therapy with LDL goal less than 70.    Echocardiogram 07-24-19: 1. Left ventricular ejection fraction, by estimation, is 35 to 40%. The  left ventricle has moderately decreased function. The left ventricle  demonstrates regional wall motion  abnormalities (see scoring  diagram/findings for description). The left  ventricular internal cavity size was mildly dilated. Left ventricular  diastolic parameters are consistent with Grade I diastolic dysfunction  (impaired relaxation). Elevated left atrial pressure.  2. Right ventricular systolic function is normal. The right ventricular  size is normal.  3. The mitral valve is normal in structure. No evidence of mitral valve  regurgitation. No evidence of mitral stenosis.  4. The aortic valve is normal in structure. Aortic valve regurgitation is  not visualized. No aortic stenosis is present.  5. The inferior vena cava is normal in size with greater than 50%  respiratory variability, suggesting right atrial pressure of 3 mmHg.   EKG:  EKG is personally reviewed.  The ekg ordered 07/12/19  demonstrates NSR, 85 bpm, stable ST-T wave changes  Recent Labs: 07/01/2019: TSH 4.530 07/02/2019: Hemoglobin 13.3; Platelets 181 08/23/2019: BUN 16; Creatinine, Ser 1.07; Potassium 5.1; Sodium 140  Recent Lipid Panel    Component Value Date/Time   CHOL 132 08/23/2019 1434   TRIG 70 08/23/2019 1434   HDL 65 08/23/2019 1434   CHOLHDL 2.0 08/23/2019 1434   CHOLHDL 3.0 07/01/2019 0343   VLDL 10 07/01/2019 0343   LDLCALC 53 08/23/2019 1434    Physical Exam:    VS:  BP 126/70 (BP Location: Left Arm, Patient Position: Sitting, Cuff Size: Large)   Pulse 65   Ht 6' (1.829 m)   Wt 248 lb 3.2 oz (112.6 kg)   SpO2 99%   BMI 33.66 kg/m    Orthostatic VS for the past 24 hrs (Last 3 readings):  BP- Lying Pulse- Lying BP- Sitting Pulse- Sitting BP- Standing at 3 minutes Pulse- Standing at 3 minutes  10/27/19 1342 (!) 80/60 87 100/70 93 120/78 87    Wt Readings from Last 3 Encounters:  10/27/19 248 lb 3.2 oz (112.6 kg)  07/29/19 261 lb 12.8 oz (118.8 kg)  07/12/19 259 lb 9.6 oz (117.8 kg)    GEN: Well nourished, well developed in no acute distress HEENT: Normal, moist mucous membranes NECK: No  JVD CARDIAC: regular rhythm, normal S1 and S2, no rubs or gallops. No murmurs. VASCULAR: Radial and DP pulses 2+ bilaterally. No carotid bruits RESPIRATORY:  Clear to auscultation without rales, wheezing or rhonchi  ABDOMEN: Soft, non-tender, non-distended MUSCULOSKELETAL:  Ambulates independently SKIN: Warm and dry, no edema NEUROLOGIC:  Alert and oriented x 3. No focal neuro deficits noted. PSYCHIATRIC:  Normal affect    ASSESSMENT:    1. Coronary artery disease involving native coronary artery of native heart without angina pectoris   2. Ischemic cardiomyopathy   3. Type 2 diabetes mellitus with diabetic autonomic neuropathy, with long-term current use of insulin (HCC)    PLAN:    Frequent near syncope and full syncope: -orthostatics today show that BP actually improved with standing. He reports that some times this is the other way around when he checks with his home BP cuff -suspect this may be autonomic dysfunction, likely 2/2 diabetes -Did orthostatic counseling: -avoid dehydration. Often it requires high volumes of fluids, often with salt/electrolytes included, to stay hydrated. People with orthostasis are very sensitive to fluid shifts and dehydration. Oral rehydration is preferred, and routine use of IV fluids is not recommended. -if tolerated, compression stocking can assist with fluid management and prevent pooling in the legs. -slow position changes are recommended -if there is a feeling of severe lightheadedness, like near to passing out, recommend lying on the floor on the back, with legs elevated up on a chair or up against the wall. -the best long term management is gradual exercise conditioning. I recommend seated exercises such as bike to start, to avoid the risk of falling with lightheadedness. Exercise programs, either through supervised programs like cardiac rehab or through personal programs, should focus on gradually increasing exercise tolerance and conditioning.    NSTEMI (borderline STEMI), with newly diagnosed CAD, s/p PCI to LAD 06/2019:  -dual antiplatelet therapy for 1 year, stressed importance of this. Continue aspirin and ticagrelor. -continue carvedilol -no angina  Hypercholesterolemia:  -on rosuvastatin 5 mg three times/week -last LDL 53 08/2019  Type II diabetes, on insulin pump and januvia.  -Consider SGLT2i or GLP1RA (prefer SGLT2i) given CAD and low EF. Defer to his  endocrinologist given that he is on an insulin pump.  Ischemic cardiomyopathy:  -on carvedilol and entresto -given his blood pressure, may need to stop medications. He is on HCTZ to counteract hyperkalemia with entresto, so would need to consider stopping not just thiazide but also entresto. Since not technically orthostatic today, will not change regimen today -EF improved on recent echo to 50-55%  Tobacco cessation: The patient was counseled on tobacco cessation today for3 minutes.  Counseling included reviewing the risks of smoking tobacco products, how it impacts the patient's current medical diagnoses and different strategies for quitting.  Pharmacotherapy to aid in tobacco cessation was not prescribed today.  Hypertension: see above re: orthostatics  Cardiac risk counseling and prevention recommendations: -recommend heart healthy/Mediterranean diet, with whole grains, fruits, vegetable, fish, lean meats, nuts, and olive oil. Limit salt. -recommend moderate walking, 3-5 times/week for 30-50 minutes each session. Aim for at least 150 minutes.week. Goal should be pace of 3 miles/hours, or walking 1.5 miles in 30 minutes -recommend avoidance of tobacco products. Avoid excess alcohol.  Plan for follow up: 3 mos or sooner as needed  Total time of encounter: 46 minutes total time of encounter, including 39 minutes spent in face-to-face patient care. This time includes coordination of care and counseling regarding orthostatics, coronary disease. Remainder of  non-face-to-face time involved reviewing chart documents/testing relevant to the patient encounter and documentation in the medical record.  Buford Dresser, MD, PhD [facility]   Medication Adjustments/Labs and Tests Ordered: Current medicines are reviewed at length with the patient today.  Concerns regarding medicines are outlined above.  No orders of the defined types were placed in this encounter.  No orders of the defined types were placed in this encounter.   Patient Instructions  Medication Instructions:  Your Physician recommend you continue on your current medication as directed.    *If you need a refill on your cardiac medications before your next appointment, please call your pharmacy*   Lab Work: None   Testing/Procedures: None   Follow-Up: At [facility], you and your health needs are our priority.  As part of our continuing mission to provide you with exceptional heart care, we have created designated Provider Care Teams.  These Care Teams include your primary Cardiologist (physician) and Advanced Practice Providers (APPs -  Physician Assistants and Nurse Practitioners) who all work together to provide you with the care you need, when you need it.  We recommend signing up for the patient portal called "MyChart".  Sign up information is provided on this After Visit Summary.  MyChart is used to connect with patients for Virtual Visits (Telemedicine).  Patients are able to view lab/test results, encounter notes, upcoming appointments, etc.  Non-urgent messages can be sent to your provider as well.   To learn more about what you can do with MyChart, go to [email or web address].    Your next appointment:   3 month(s)  The format for your next appointment:   In Person  Provider:   Buford Dresser, MD       Signed, Buford Dresser, MD PhD 10/27/2019 2:01 PM    [place]

## 2019-10-27 NOTE — Patient Instructions (Signed)

## 2019-11-11 DIAGNOSIS — Z01818 Encounter for other preprocedural examination: Secondary | ICD-10-CM | POA: Diagnosis not present

## 2019-11-12 ENCOUNTER — Ambulatory Visit
Admission: RE | Admit: 2019-11-12 | Discharge: 2019-11-12 | Disposition: A | Discharge status: Home / Self Care | Payer: Federal, State, Local not specified - PPO | Source: Ambulatory Visit | Attending: Geriatric Medicine | Admitting: Geriatric Medicine

## 2019-11-12 DIAGNOSIS — J8410 Pulmonary fibrosis, unspecified: Secondary | ICD-10-CM | POA: Diagnosis not present

## 2019-11-12 DIAGNOSIS — I2510 Atherosclerotic heart disease of native coronary artery without angina pectoris: Secondary | ICD-10-CM | POA: Diagnosis not present

## 2019-11-12 DIAGNOSIS — J432 Centrilobular emphysema: Secondary | ICD-10-CM | POA: Diagnosis not present

## 2019-11-12 DIAGNOSIS — I700 Atherosclerosis of aorta: Secondary | ICD-10-CM | POA: Diagnosis not present

## 2019-11-12 DIAGNOSIS — F17200 Nicotine dependence, unspecified, uncomplicated: Secondary | ICD-10-CM

## 2019-12-01 DIAGNOSIS — Z0000 Encounter for general adult medical examination without abnormal findings: Secondary | ICD-10-CM | POA: Diagnosis not present

## 2019-12-01 DIAGNOSIS — Z79899 Other long term (current) drug therapy: Secondary | ICD-10-CM | POA: Diagnosis not present

## 2019-12-01 DIAGNOSIS — Z125 Encounter for screening for malignant neoplasm of prostate: Secondary | ICD-10-CM | POA: Diagnosis not present

## 2019-12-01 DIAGNOSIS — Z23 Encounter for immunization: Secondary | ICD-10-CM | POA: Diagnosis not present

## 2019-12-11 ENCOUNTER — Other Ambulatory Visit: Payer: Self-pay | Admitting: Cardiology

## 2019-12-13 DIAGNOSIS — G4733 Obstructive sleep apnea (adult) (pediatric): Secondary | ICD-10-CM | POA: Diagnosis not present

## 2019-12-16 ENCOUNTER — Other Ambulatory Visit: Payer: Self-pay | Admitting: Cardiology

## 2019-12-20 DIAGNOSIS — E119 Type 2 diabetes mellitus without complications: Secondary | ICD-10-CM | POA: Diagnosis not present

## 2019-12-20 DIAGNOSIS — H35033 Hypertensive retinopathy, bilateral: Secondary | ICD-10-CM | POA: Diagnosis not present

## 2019-12-20 DIAGNOSIS — H52223 Regular astigmatism, bilateral: Secondary | ICD-10-CM | POA: Diagnosis not present

## 2020-01-15 ENCOUNTER — Other Ambulatory Visit: Payer: Self-pay | Admitting: Medical

## 2020-01-15 ENCOUNTER — Other Ambulatory Visit: Payer: Self-pay | Admitting: Cardiology

## 2020-01-31 ENCOUNTER — Encounter: Payer: Self-pay | Admitting: Cardiology

## 2020-01-31 ENCOUNTER — Ambulatory Visit (INDEPENDENT_AMBULATORY_CARE_PROVIDER_SITE_OTHER): Payer: Federal, State, Local not specified - PPO | Admitting: Cardiology

## 2020-01-31 ENCOUNTER — Other Ambulatory Visit: Payer: Self-pay

## 2020-01-31 VITALS — BP 116/70 | HR 81 | Ht 72.0 in | Wt 242.0 lb

## 2020-01-31 DIAGNOSIS — R42 Dizziness and giddiness: Secondary | ICD-10-CM | POA: Diagnosis not present

## 2020-01-31 DIAGNOSIS — I255 Ischemic cardiomyopathy: Secondary | ICD-10-CM

## 2020-01-31 DIAGNOSIS — I2510 Atherosclerotic heart disease of native coronary artery without angina pectoris: Secondary | ICD-10-CM

## 2020-01-31 DIAGNOSIS — Z794 Long term (current) use of insulin: Secondary | ICD-10-CM

## 2020-01-31 DIAGNOSIS — Z720 Tobacco use: Secondary | ICD-10-CM

## 2020-01-31 DIAGNOSIS — E1143 Type 2 diabetes mellitus with diabetic autonomic (poly)neuropathy: Secondary | ICD-10-CM

## 2020-01-31 DIAGNOSIS — I252 Old myocardial infarction: Secondary | ICD-10-CM

## 2020-01-31 NOTE — Progress Notes (Signed)
Cardiology Office Note:    Date:  01/31/2020   ID:  [name], DOB 08/30/1955, MRN [id number]  PCP:  Lajean Manes, MD  Cardiologist:  Buford Dresser, MD  Referring MD: Lajean Manes, MD   CC: follow up  History of Present Illness:    Gabriel Bean is a 64 y.o. male with a hx of CAD with NSTEMI 06/2019, ischemic cardiomyopathy, type II diabetes on insulin pump, hyperlipidemia, hypertension, tobacco use, OSA on CPAP who is seen for follow up today. I met him during his hospitalization for NSTEMI 06/2019.  Today: Rare brief shortness of breath, both at rest and with exertion. Lasts 3-4 breaths. Continues to have issue with lightheadedness, had near fall recently. Feels like he loses consciousness for 1-2 seconds with many of these events.   In the process of changing from [place] to [place]. Has worked on losing 30 lbs. Discussed stopping HCTZ when he starts jardiance.   Still smoking, just over 1/2 ppd.   Due for colonoscopy with Dr. Watt Climes. We discussed this. He is now 6 mos post stent. Discussed pros and cons of holding antoplatelet vs. Waiting until after April. He would prefer to wait as he has no urgent concerns.  Denies chest pain. No PND, orthopnea, LE edema or unexpected weight gain. No palpitations.  Past Medical History:  Diagnosis Date  . Chronic rhinitis   . Coronary artery disease   . GERD (gastroesophageal reflux disease)   . History of skin cancer   . Hypertension   . Low libido   . OCD (obsessive compulsive disorder)   . OSA (obstructive sleep apnea)   . Seasonal allergies     Past Surgical History:  Procedure Laterality Date  . APPENDECTOMY    . COLONOSCOPY     x3  . CORONARY STENT INTERVENTION N/A 07/01/2019   Procedure: CORONARY STENT INTERVENTION;  Surgeon: Troy Sine, MD;  Location: [facility];  Service: Cardiovascular;  Laterality: N/A;  LAD  . LEFT HEART CATH AND CORONARY ANGIOGRAPHY N/A 07/01/2019   Procedure: LEFT  HEART CATH AND CORONARY ANGIOGRAPHY;  Surgeon: Troy Sine, MD;  Location: [facility];  Service: Cardiovascular;  Laterality: N/A;  . SKIN CANCER EXCISION    . TONSILLECTOMY    . WISDOM TOOTH EXTRACTION      Current Medications: Current Outpatient Medications on File Prior to Visit  Medication Sig  . allopurinol (ZYLOPRIM) 300 MG tablet Take 300 mg by mouth daily.  Marland Kitchen aspirin 81 MG chewable tablet Chew 1 tablet (81 mg total) by mouth daily.  Marland Kitchen BRILINTA 90 MG TABS tablet TAKE 1 TABLET BY MOUTH TWICE DAILY  . buPROPion (WELLBUTRIN XL) 300 MG 24 hr tablet Take 300 mg by mouth daily.   . carvedilol (COREG) 12.5 MG tablet TAKE 1 TABLET(12.5 MG) BY MOUTH TWICE DAILY  . Continuous Blood Gluc Sensor (FREESTYLE LIBRE 14 DAY SENSOR) MISC SMARTSIG:1 Each Topical Every 2 Weeks  . DULoxetine (CYMBALTA) 60 MG capsule Take 120 mg by mouth daily.  Marland Kitchen ENTRESTO 24-26 MG TAKE 1 TABLET BY MOUTH TWICE DAILY  . ezetimibe (ZETIA) 10 MG tablet Take 10 mg by mouth daily.  . hydrochlorothiazide (MICROZIDE) 12.5 MG capsule Take 1 capsule (12.5 mg total) by mouth daily.  . Insulin Human (INSULIN PUMP) SOLN Inject into the skin continuous. novolog  . JANUVIA 100 MG tablet Take 100 mg by mouth daily.  Marland Kitchen NOVOLOG 100 UNIT/ML injection SMARTSIG:100 Unit(s) SUB-Q Daily  . rosuvastatin (CRESTOR)  5 MG tablet Take 1 tablet 3-5 times per week as tolerated   No current facility-administered medications on file prior to visit.     Allergies:   Patient has no known allergies.   Social History   Tobacco Use  . Smoking status: Current Every Day Smoker    Packs/day: 1.00    Types: Cigarettes  . Smokeless tobacco: Current User    Types: Snuff  . Tobacco comment: started smoking at age 34.    Vaping Use  . Vaping Use: Never used  Substance Use Topics  . Alcohol use: Yes    Comment: 5 drinks per day  . Drug use: No    Family History: family history includes Cancer in his father; Colon cancer in his father;  Heart disease in his brother and father; Other in his mother; Skin cancer in his father.  ROS:   Please see the history of present illness.  Additional pertinent ROS otherwise unremarkable.  EKGs/Labs/Other Studies Reviewed:    The following studies were reviewed today: LHC 22-Jul-2019:   Prox RCA to Mid RCA lesion is 60% stenosed.  Prox LAD to Mid LAD lesion is 100% stenosed.  Post intervention, there is a 0% residual stenosis.  A stent was successfully placed.  Total occlusion of the proximal LAD with initial TIMI 0 flow and faint septal collateralization via the distal RCA.  Normal left circumflex coronary artery.   Dominant RCA with 60% mid RCA stenosis..  Low normal to mildly impaired LV function with EF estimate at 50%; LVEDP 16 mmHg.  PCI to the LAD with ultimate insertion of a 3.0 x 26 mm Resolute Onyx DES stent postdilated to 3.25 mm with the 100% occlusion being reduced to 0% and TIMI 0 flow being improved to TIMI-3 flow.  RECOMMENDATION: DAPT for minimum of 1 year. Smoking cessation is essential. Optimal blood pressure control with target blood pressure less than 130/80 and ideal less than 120/80. Aggressive lipid-lowering therapy with LDL goal less than 70.    Echocardiogram Jul 22, 2019: 1. Left ventricular ejection fraction, by estimation, is 35 to 40%. The  left ventricle has moderately decreased function. The left ventricle  demonstrates regional wall motion abnormalities (see scoring  diagram/findings for description). The left  ventricular internal cavity size was mildly dilated. Left ventricular  diastolic parameters are consistent with Grade I diastolic dysfunction  (impaired relaxation). Elevated left atrial pressure.  2. Right ventricular systolic function is normal. The right ventricular  size is normal.  3. The mitral valve is normal in structure. No evidence of mitral valve  regurgitation. No evidence of mitral stenosis.  4. The aortic  valve is normal in structure. Aortic valve regurgitation is  not visualized. No aortic stenosis is present.  5. The inferior vena cava is normal in size with greater than 50%  respiratory variability, suggesting right atrial pressure of 3 mmHg.   EKG:  EKG is personally reviewed.  The ekg ordered 07/12/19 demonstrates NSR, 85 bpm, stable ST-T wave changes  Recent Labs: Jul 22, 2019: TSH 4.530 07/02/2019: Hemoglobin 13.3; Platelets 181 08/23/2019: BUN 16; Creatinine, Ser 1.07; Potassium 5.1; Sodium 140  Recent Lipid Panel    Component Value Date/Time   CHOL 132 08/23/2019 1434   TRIG 70 08/23/2019 1434   HDL 65 08/23/2019 1434   CHOLHDL 2.0 08/23/2019 1434   CHOLHDL 3.0 Jul 22, 2019 0343   VLDL 10 07-22-2019 0343   LDLCALC 53 08/23/2019 1434    Physical Exam:    VS:  BP 116/70  Pulse 81   Ht 6' (1.829 m)   Wt 242 lb (109.8 kg)   SpO2 96%   BMI 32.82 kg/m    No data found.  Wt Readings from Last 3 Encounters:  01/31/20 242 lb (109.8 kg)  10/27/19 248 lb 3.2 oz (112.6 kg)  07/29/19 261 lb 12.8 oz (118.8 kg)    GEN: Well nourished, well developed in no acute distress HEENT: Normal, moist mucous membranes NECK: No JVD CARDIAC: regular rhythm, normal S1 and S2, no rubs or gallops. No murmur. VASCULAR: Radial and DP pulses 2+ bilaterally. No carotid bruits RESPIRATORY:  Clear to auscultation without rales, wheezing or rhonchi  ABDOMEN: Soft, non-tender, non-distended MUSCULOSKELETAL:  Ambulates independently SKIN: Warm and dry, no edema NEUROLOGIC:  Alert and oriented x 3. No focal neuro deficits noted. PSYCHIATRIC:  Normal affect   ASSESSMENT:    1. Coronary artery disease involving native coronary artery of native heart without angina pectoris   2. Tobacco abuse   3. Intermittent lightheadedness   4. Ischemic cardiomyopathy   5. History of non-ST elevation myocardial infarction (NSTEMI)   6. Type 2 diabetes mellitus with diabetic autonomic neuropathy, with long-term  current use of insulin (HCC)    PLAN:    Frequent near syncope/lightheadedness and syncope: -prior orthostatics improved with standing -suspect this may be autonomic dysfunction, likely 2/2 diabetes -we reviewed general management of this, see prior notes  NSTEMI (borderline STEMI), with newly diagnosed CAD, s/p PCI to LAD 06/2019:  -dual antiplatelet therapy for 1 year, stressed importance of this. Continue aspirin and ticagrelor. -continue carvedilol -no angina -he could hold ticagrelor now that he is >6 mos out from stent, but if non-urgent would consider waiting until after 06/2020 so he can complete DAPT for 12 months  Hypercholesterolemia:  -on rosuvastatin 5 mg three times/week -last LDL 53 08/2019  Type II diabetes, on insulin pump and januvia.  -per patient, transitioning from Januvia to [place] given CAD and low EF. Agree with this, would hold HCTZ once started on Jardiance, but monitor potassium once HCTZ stopped given that this was elevated previously on entresto alone. He reports being due for labs, will have these sent to me  Ischemic cardiomyopathy:  -on carvedilol and entresto.  -EF improved on recent echo to 50-55%  Tobacco cessation: working to cut back  Cardiac risk counseling and prevention recommendations: -recommend heart healthy/Mediterranean diet, with whole grains, fruits, vegetable, fish, lean meats, nuts, and olive oil. Limit salt. -recommend moderate walking, 3-5 times/week for 30-50 minutes each session. Aim for at least 150 minutes.week. Goal should be pace of 3 miles/hours, or walking 1.5 miles in 30 minutes -recommend avoidance of tobacco products. Avoid excess alcohol.  Plan for follow up: 5 mos or sooner as needed  Buford Dresser, MD, PhD [place]  [facility]   Medication Adjustments/Labs and Tests Ordered: Current medicines are reviewed at length with the patient today.  Concerns regarding medicines are outlined above.  No  orders of the defined types were placed in this encounter.  No orders of the defined types were placed in this encounter.   Patient Instructions  Medication Instructions:  Stop Hydrochlorothiazide 12.5 mg  *If you need a refill on your cardiac medications before your next appointment, please call your pharmacy*   Lab Work: None   Testing/Procedures: None   Follow-Up: At [facility], you and your health needs are our priority.  As part of our continuing mission to provide you with exceptional heart care, we  have created designated Provider Care Teams.  These Care Teams include your primary Cardiologist (physician) and Advanced Practice Providers (APPs -  Physician Assistants and Nurse Practitioners) who all work together to provide you with the care you need, when you need it.  We recommend signing up for the patient portal called "MyChart".  Sign up information is provided on this After Visit Summary.  MyChart is used to connect with patients for Virtual Visits (Telemedicine).  Patients are able to view lab/test results, encounter notes, upcoming appointments, etc.  Non-urgent messages can be sent to your provider as well.   To learn more about what you can do with MyChart, go to [email or web address].    Your next appointment:   5 month(s)  The format for your next appointment:   In Person  Provider:   Buford Dresser, MD       Signed, Buford Dresser, MD PhD 01/31/2020  [place]

## 2020-01-31 NOTE — Patient Instructions (Signed)
Medication Instructions:  Stop Hydrochlorothiazide 12.5 mg  *If you need a refill on your cardiac medications before your next appointment, please call your pharmacy*   Lab Work: None   Testing/Procedures: None   Follow-Up: At [facility], you and your health needs are our priority.  As part of our continuing mission to provide you with exceptional heart care, we have created designated Provider Care Teams.  These Care Teams include your primary Cardiologist (physician) and Advanced Practice Providers (APPs -  Physician Assistants and Nurse Practitioners) who all work together to provide you with the care you need, when you need it.  We recommend signing up for the patient portal called "MyChart".  Sign up information is provided on this After Visit Summary.  MyChart is used to connect with patients for Virtual Visits (Telemedicine).  Patients are able to view lab/test results, encounter notes, upcoming appointments, etc.  Non-urgent messages can be sent to your provider as well.   To learn more about what you can do with MyChart, go to [email or web address].    Your next appointment:   5 month(s)  The format for your next appointment:   In Person  Provider:   Jodelle Red, MD

## 2020-02-09 DIAGNOSIS — I10 Essential (primary) hypertension: Secondary | ICD-10-CM | POA: Diagnosis not present

## 2020-02-09 DIAGNOSIS — E7800 Pure hypercholesterolemia, unspecified: Secondary | ICD-10-CM | POA: Diagnosis not present

## 2020-02-09 DIAGNOSIS — E1165 Type 2 diabetes mellitus with hyperglycemia: Secondary | ICD-10-CM | POA: Diagnosis not present

## 2020-02-09 DIAGNOSIS — R5383 Other fatigue: Secondary | ICD-10-CM | POA: Diagnosis not present

## 2020-02-14 DIAGNOSIS — E7800 Pure hypercholesterolemia, unspecified: Secondary | ICD-10-CM | POA: Diagnosis not present

## 2020-02-14 DIAGNOSIS — I10 Essential (primary) hypertension: Secondary | ICD-10-CM | POA: Diagnosis not present

## 2020-02-14 DIAGNOSIS — Z9641 Presence of insulin pump (external) (internal): Secondary | ICD-10-CM | POA: Diagnosis not present

## 2020-02-14 DIAGNOSIS — E1165 Type 2 diabetes mellitus with hyperglycemia: Secondary | ICD-10-CM | POA: Diagnosis not present

## 2020-03-13 DIAGNOSIS — G4733 Obstructive sleep apnea (adult) (pediatric): Secondary | ICD-10-CM | POA: Diagnosis not present

## 2020-04-10 DIAGNOSIS — G4721 Circadian rhythm sleep disorder, delayed sleep phase type: Secondary | ICD-10-CM | POA: Diagnosis not present

## 2020-04-10 DIAGNOSIS — G4733 Obstructive sleep apnea (adult) (pediatric): Secondary | ICD-10-CM | POA: Diagnosis not present

## 2020-05-07 DIAGNOSIS — R42 Dizziness and giddiness: Secondary | ICD-10-CM | POA: Insufficient documentation

## 2020-05-07 DIAGNOSIS — I2510 Atherosclerotic heart disease of native coronary artery without angina pectoris: Secondary | ICD-10-CM | POA: Insufficient documentation

## 2020-05-07 DIAGNOSIS — I255 Ischemic cardiomyopathy: Secondary | ICD-10-CM | POA: Insufficient documentation

## 2020-05-07 DIAGNOSIS — I252 Old myocardial infarction: Secondary | ICD-10-CM | POA: Insufficient documentation

## 2020-05-07 HISTORY — DX: Atherosclerotic heart disease of native coronary artery without angina pectoris: I25.10

## 2020-05-07 HISTORY — DX: Ischemic cardiomyopathy: I25.5

## 2020-06-14 ENCOUNTER — Telehealth: Payer: Self-pay | Admitting: Cardiology

## 2020-06-14 NOTE — Telephone Encounter (Signed)
3.30.22 LM on cell to schedule 5 mo fu w/Dr. Cristal Deer. LP

## 2020-08-07 NOTE — Progress Notes (Signed)
Cardiology Office Note:    Date:  08/08/2020   ID:  Gabriel Bean, DOB 31-Oct-1955, MRN [id number]  PCP:  Merlene Laughter, MD  Cardiologist:  Jodelle Red, MD   Referring MD: Merlene Laughter, MD   Chief Complaint  Patient presents with  . Follow-up  CAD s/p PCI  History of Present Illness:    Gabriel Bean is a 65 y.o. male with a hx of CAD and NSTEMI 06/2019, ischemic cardiomyopathy, DM2 on insulin pump, HLD, HTN, tobacco use, and OSA on CPAP. NSTEMI treated with DES to LAD 07/01/19. EF at that time was 35-40%, which improved to 50-55% on echo 10/05/19.  He was last seen by Dr. Cristal Deer 01/31/20. He was doing well and working on weight loss. He was working with PCP to change from [place] to [place] and subsequent discontinuation of HCTZ.   He presents today for routine follow up. He reports doing well, but does have issues with vasovagal syncope. As such, he is fairly sedentary, does not walk outside of the house. He discusses being able to pop his neck. He questions if this is contributory to his vagus nerve issues. He has stable dyspnea when he first lays down at night and randomly throughout the day. He continues to smoke. This dyspnea is not bothersome to him. No lower extremity swelling and no PND. No chest pain.     Past Medical History:  Diagnosis Date  . Chronic rhinitis   . Coronary artery disease   . GERD (gastroesophageal reflux disease)   . History of skin cancer   . Hypertension   . Low libido   . OCD (obsessive compulsive disorder)   . OSA (obstructive sleep apnea)   . Seasonal allergies     Past Surgical History:  Procedure Laterality Date  . APPENDECTOMY    . COLONOSCOPY     x3  . CORONARY STENT INTERVENTION N/A 07/01/2019   Procedure: CORONARY STENT INTERVENTION;  Surgeon: Lennette Bihari, MD;  [facility];  Service: Cardiovascular;  Laterality: N/A;  LAD  . LEFT HEART CATH AND CORONARY ANGIOGRAPHY N/A 07/01/2019   Procedure:  LEFT HEART CATH AND CORONARY ANGIOGRAPHY;  Surgeon: Lennette Bihari, MD;  Location: [facility];  Service: Cardiovascular;  Laterality: N/A;  . SKIN CANCER EXCISION    . TONSILLECTOMY    . WISDOM TOOTH EXTRACTION      Current Medications: Current Meds  Medication Sig  . allopurinol (ZYLOPRIM) 300 MG tablet Take 300 mg by mouth daily.  Marland Kitchen aspirin 81 MG chewable tablet Chew 1 tablet (81 mg total) by mouth daily.  Marland Kitchen buPROPion (WELLBUTRIN XL) 300 MG 24 hr tablet Take 300 mg by mouth daily.   . carvedilol (COREG) 12.5 MG tablet TAKE 1 TABLET(12.5 MG) BY MOUTH TWICE DAILY  . clopidogrel (PLAVIX) 75 MG tablet Take 1 tablet (75 mg total) by mouth daily.  . Continuous Blood Gluc Sensor (FREESTYLE LIBRE 14 DAY SENSOR) MISC SMARTSIG:1 Each Topical Every 2 Weeks  . DULoxetine (CYMBALTA) 60 MG capsule Take 120 mg by mouth daily.  . empagliflozin (JARDIANCE) 25 MG TABS tablet Take 25 mg by mouth daily.  Marland Kitchen ENTRESTO 24-26 MG TAKE 1 TABLET BY MOUTH TWICE DAILY  . ezetimibe (ZETIA) 10 MG tablet Take 10 mg by mouth daily.  . Insulin Human (INSULIN PUMP) SOLN Inject into the skin continuous. novolog  . NOVOLOG 100 UNIT/ML injection SMARTSIG:100 Unit(s) SUB-Q Daily  . rosuvastatin (CRESTOR) 5 MG tablet Take 1  tablet 3-5 times per week as tolerated  . [DISCONTINUED] BRILINTA 90 MG TABS tablet TAKE 1 TABLET BY MOUTH TWICE DAILY     Allergies:   Patient has no known allergies.   Social History   Socioeconomic History  . Marital status: Married    Spouse name: Not on file  . Number of children: 0  . Years of education: Not on file  . Highest education level: Not on file  Occupational History  . Occupation: Animal nutritionist: [employer]  Tobacco Use  . Smoking status: Current Every Day Smoker    Packs/day: 1.00    Types: Cigarettes  . Smokeless tobacco: Current User    Types: Snuff  . Tobacco comment: started smoking at age 66.    Vaping Use  . Vaping Use: Never used   Substance and Sexual Activity  . Alcohol use: Yes    Comment: 5 drinks per day  . Drug use: No  . Sexual activity: Not on file  Other Topics Concern  . Not on file  Social History Narrative  . Not on file   Social Determinants of Health   Financial Resource Strain: Not on file  Food Insecurity: Not on file  Transportation Needs: Not on file  Physical Activity: Not on file  Stress: Not on file  Social Connections: Not on file     Family History: The patient's family history includes Cancer in his father; Colon cancer in his father; Heart disease in his brother and father; Other in his mother; Skin cancer in his father.  ROS:   Please see the history of present illness.     All other systems reviewed and are negative.  EKGs/Labs/Other Studies Reviewed:    The following studies were reviewed today:  Echo 09/2019: 1. Left ventricular ejection fraction, by estimation, is 50 to 55%. The  left ventricle has low normal function. The left ventricle demonstrates  regional wall motion abnormalities (see scoring diagram/findings for  description). Mid to apical anteroseptal  hypokinesis. Apex not well visualized but appears hypokinetic. Left  ventricular diastolic parameters are indeterminate.  2. Right ventricular systolic function is normal. The right ventricular  size is normal. There is normal pulmonary artery systolic pressure. The  estimated right ventricular systolic pressure is 23.6 mmHg.  3. The mitral valve is normal in structure. Mild mitral valve  regurgitation.  4. The aortic valve is tricuspid. Aortic valve regurgitation is not  visualized. Mild aortic valve sclerosis is present, with no evidence of  aortic valve stenosis.  5. The inferior vena cava is normal in size with greater than 50%  respiratory variability, suggesting right atrial pressure of 3 mmHg.   EKG:  EKG is not ordered today.    Recent Labs: 08/23/2019: BUN 16; Creatinine, Ser 1.07; Potassium  5.1; Sodium 140  Recent Lipid Panel    Component Value Date/Time   CHOL 132 08/23/2019 1434   TRIG 70 08/23/2019 1434   HDL 65 08/23/2019 1434   CHOLHDL 2.0 08/23/2019 1434   CHOLHDL 3.0 07/01/2019 0343   VLDL 10 07/01/2019 0343   LDLCALC 53 08/23/2019 1434    Physical Exam:    VS:  BP 110/70 (BP Location: Left Arm, Patient Position: Sitting, Cuff Size: Normal)   Pulse 64   Ht 6' (1.829 m)   Wt 245 lb 9.6 oz (111.4 kg)   BMI 33.31 kg/m     Wt Readings from Last 3 Encounters:  08/08/20 245 lb 9.6  oz (111.4 kg)  01/31/20 242 lb (109.8 kg)  10/27/19 248 lb 3.2 oz (112.6 kg)     GEN: Well nourished, well developed in no acute distress HEENT: Normal NECK: No JVD; No carotid bruits LYMPHATICS: No lymphadenopathy CARDIAC: RRR, no murmurs, rubs, gallops RESPIRATORY:  Clear to auscultation without rales, wheezing or rhonchi  ABDOMEN: Soft, non-tender, non-distended MUSCULOSKELETAL:  No edema; No deformity  SKIN: Warm and dry NEUROLOGIC:  Alert and oriented x 3 PSYCHIATRIC:  Normal affect   ASSESSMENT:    1. Coronary artery disease involving native coronary artery of native heart without angina pectoris   2. History of non-ST elevation myocardial infarction (NSTEMI)   3. Ischemic cardiomyopathy   4. Essential hypertension   5. Mixed hyperlipidemia   6. Type 2 diabetes mellitus with diabetic autonomic neuropathy, with long-term current use of insulin (HCC)   7. Tobacco abuse   8. Preoperative clearance    PLAN:    In order of problems listed above:  CAD s/p DES-LAD (06/2019) - has been maintained on ASA and brilinta - he is now 12 months post PCI - he continues to smoke - after discussion with Dr. Cristal Deer, will transition him off of brilinta and to ASA/plavix due to ongoing smoking   Ischemic cardiomyopathy - EF initially down to 35-40%, improved to 50-55% 3 months post PCI - he is maintained on low does entresto, coreg, and jardiance   Hypertension -  continue coreg and entresto - well-controlled   Hyperlipidemia with LDL goal < 70 08/23/2019: Cholesterol, Total 132; HDL 65; LDL Chol Calc (NIH) 53; Triglycerides 70 - continue 5 mg crestor   DM - A1c 7.0% - maintained on insulin pump and jardiance   Current smoker - knows he needs to stop, does not want to   Preop clearance for colonoscopy He is fairly sedentary but can complete 4.0 METS without chest pain. He understands he is at risk for cardiac complications and wishes to proceed. No additional cardiovascular testing prior to procedure. He may hold plavix 5-7 days prior to procedure. Restart after when safe to do so.    [facility]  Dr. Ewing Schlein   Medication Adjustments/Labs and Tests Ordered: Current medicines are reviewed at length with the patient today.  Concerns regarding medicines are outlined above.  No orders of the defined types were placed in this encounter.  Meds ordered this encounter  Medications  . clopidogrel (PLAVIX) 75 MG tablet    Sig: Take 1 tablet (75 mg total) by mouth daily.    Dispense:  30 tablet    Refill:  6    Signed, Marcelino Duster, [place]  08/08/2020 4:14 PM    [facility]

## 2020-08-08 ENCOUNTER — Other Ambulatory Visit: Payer: Self-pay

## 2020-08-08 ENCOUNTER — Encounter: Payer: Self-pay | Admitting: Physician Assistant

## 2020-08-08 ENCOUNTER — Ambulatory Visit: Payer: Federal, State, Local not specified - PPO | Admitting: Physician Assistant

## 2020-08-08 VITALS — BP 110/70 | HR 64 | Ht 72.0 in | Wt 245.6 lb

## 2020-08-08 DIAGNOSIS — E1143 Type 2 diabetes mellitus with diabetic autonomic (poly)neuropathy: Secondary | ICD-10-CM

## 2020-08-08 DIAGNOSIS — Z720 Tobacco use: Secondary | ICD-10-CM

## 2020-08-08 DIAGNOSIS — I10 Essential (primary) hypertension: Secondary | ICD-10-CM

## 2020-08-08 DIAGNOSIS — I2510 Atherosclerotic heart disease of native coronary artery without angina pectoris: Secondary | ICD-10-CM | POA: Diagnosis not present

## 2020-08-08 DIAGNOSIS — Z01818 Encounter for other preprocedural examination: Secondary | ICD-10-CM

## 2020-08-08 DIAGNOSIS — Z794 Long term (current) use of insulin: Secondary | ICD-10-CM

## 2020-08-08 DIAGNOSIS — I252 Old myocardial infarction: Secondary | ICD-10-CM

## 2020-08-08 DIAGNOSIS — I255 Ischemic cardiomyopathy: Secondary | ICD-10-CM | POA: Diagnosis not present

## 2020-08-08 DIAGNOSIS — Z01810 Encounter for preprocedural cardiovascular examination: Secondary | ICD-10-CM

## 2020-08-08 DIAGNOSIS — E782 Mixed hyperlipidemia: Secondary | ICD-10-CM

## 2020-08-08 MED ORDER — CLOPIDOGREL BISULFATE 75 MG PO TABS: 75.0000 mg | ORAL_TABLET | Freq: Every day | ORAL | 6 refills | Status: DC

## 2020-08-08 NOTE — Patient Instructions (Signed)
Medication Instructions:  FINISH BRILINTA THEN START PLAVIX 75MG  DAILY *If you need a refill on your cardiac medications before your next appointment, please call your pharmacy*  Lab Work:   Testing/Procedures:  NONE    NONE  Follow-Up: Your next appointment:  6 month(s) In Person with , MD SHE WILL BE AT OUR [facility]  Please call our office 2 months in advance to schedule this appointment   At [facility], you and your health needs are our priority.  As part of our continuing mission to provide you with exceptional heart care, we have created designated Provider Care Teams.  These Care Teams include your primary Cardiologist (physician) and Advanced Practice Providers (APPs -  Physician Assistants and Nurse Practitioners) who all work together to provide you with the care you need, when you need it.

## 2020-08-15 DIAGNOSIS — E1165 Type 2 diabetes mellitus with hyperglycemia: Secondary | ICD-10-CM | POA: Diagnosis not present

## 2020-08-15 DIAGNOSIS — Z9641 Presence of insulin pump (external) (internal): Secondary | ICD-10-CM | POA: Diagnosis not present

## 2020-08-15 DIAGNOSIS — I10 Essential (primary) hypertension: Secondary | ICD-10-CM | POA: Diagnosis not present

## 2020-08-15 DIAGNOSIS — E7800 Pure hypercholesterolemia, unspecified: Secondary | ICD-10-CM | POA: Diagnosis not present

## 2020-10-24 DIAGNOSIS — I10 Essential (primary) hypertension: Secondary | ICD-10-CM | POA: Diagnosis not present

## 2020-10-24 DIAGNOSIS — I700 Atherosclerosis of aorta: Secondary | ICD-10-CM | POA: Diagnosis not present

## 2020-10-24 DIAGNOSIS — Z79899 Other long term (current) drug therapy: Secondary | ICD-10-CM | POA: Diagnosis not present

## 2020-10-24 DIAGNOSIS — E7800 Pure hypercholesterolemia, unspecified: Secondary | ICD-10-CM | POA: Diagnosis not present

## 2020-10-24 DIAGNOSIS — E1142 Type 2 diabetes mellitus with diabetic polyneuropathy: Secondary | ICD-10-CM | POA: Diagnosis not present

## 2020-10-26 ENCOUNTER — Other Ambulatory Visit: Payer: Self-pay | Admitting: Geriatric Medicine

## 2020-10-26 DIAGNOSIS — F17210 Nicotine dependence, cigarettes, uncomplicated: Secondary | ICD-10-CM

## 2020-11-07 DIAGNOSIS — K5730 Diverticulosis of large intestine without perforation or abscess without bleeding: Secondary | ICD-10-CM | POA: Diagnosis not present

## 2020-11-07 DIAGNOSIS — D120 Benign neoplasm of cecum: Secondary | ICD-10-CM | POA: Diagnosis not present

## 2020-11-07 DIAGNOSIS — Z86010 Personal history of colonic polyps: Secondary | ICD-10-CM | POA: Diagnosis not present

## 2020-11-07 DIAGNOSIS — D123 Benign neoplasm of transverse colon: Secondary | ICD-10-CM | POA: Diagnosis not present

## 2020-11-14 ENCOUNTER — Other Ambulatory Visit: Payer: Self-pay | Admitting: Cardiology

## 2020-11-21 ENCOUNTER — Ambulatory Visit
Admission: RE | Admit: 2020-11-21 | Discharge: 2020-11-21 | Disposition: A | Discharge status: Home / Self Care | Payer: Federal, State, Local not specified - PPO | Source: Ambulatory Visit | Attending: Geriatric Medicine | Admitting: Geriatric Medicine

## 2020-11-21 ENCOUNTER — Other Ambulatory Visit: Payer: Self-pay

## 2020-11-21 DIAGNOSIS — F17210 Nicotine dependence, cigarettes, uncomplicated: Secondary | ICD-10-CM | POA: Diagnosis not present

## 2020-11-24 DIAGNOSIS — Z23 Encounter for immunization: Secondary | ICD-10-CM | POA: Diagnosis not present

## 2020-12-18 ENCOUNTER — Other Ambulatory Visit: Payer: Self-pay | Admitting: Cardiology

## 2021-01-29 ENCOUNTER — Other Ambulatory Visit: Payer: Self-pay

## 2021-01-29 MED ORDER — CLOPIDOGREL BISULFATE 75 MG PO TABS
75.0000 mg | ORAL_TABLET | Freq: Every day | ORAL | 6 refills | Status: DC
Start: 2021-01-29 — End: 2021-09-27

## 2021-01-31 DIAGNOSIS — E119 Type 2 diabetes mellitus without complications: Secondary | ICD-10-CM | POA: Diagnosis not present

## 2021-01-31 DIAGNOSIS — H5201 Hypermetropia, right eye: Secondary | ICD-10-CM | POA: Diagnosis not present

## 2021-01-31 DIAGNOSIS — I10 Essential (primary) hypertension: Secondary | ICD-10-CM | POA: Diagnosis not present

## 2021-01-31 DIAGNOSIS — H35033 Hypertensive retinopathy, bilateral: Secondary | ICD-10-CM | POA: Diagnosis not present

## 2021-02-21 ENCOUNTER — Other Ambulatory Visit: Payer: Self-pay | Admitting: Cardiology

## 2021-02-22 DIAGNOSIS — I10 Essential (primary) hypertension: Secondary | ICD-10-CM | POA: Diagnosis not present

## 2021-02-22 DIAGNOSIS — E1165 Type 2 diabetes mellitus with hyperglycemia: Secondary | ICD-10-CM | POA: Diagnosis not present

## 2021-02-22 DIAGNOSIS — E7800 Pure hypercholesterolemia, unspecified: Secondary | ICD-10-CM | POA: Diagnosis not present

## 2021-02-22 DIAGNOSIS — Z9641 Presence of insulin pump (external) (internal): Secondary | ICD-10-CM | POA: Diagnosis not present

## 2021-03-30 ENCOUNTER — Ambulatory Visit (HOSPITAL_BASED_OUTPATIENT_CLINIC_OR_DEPARTMENT_OTHER): Payer: Federal, State, Local not specified - PPO | Admitting: Cardiology

## 2021-03-30 ENCOUNTER — Other Ambulatory Visit: Payer: Self-pay

## 2021-03-30 ENCOUNTER — Encounter (HOSPITAL_BASED_OUTPATIENT_CLINIC_OR_DEPARTMENT_OTHER): Payer: Self-pay | Admitting: Cardiology

## 2021-03-30 VITALS — BP 126/84 | HR 86 | Ht 72.0 in | Wt 253.4 lb

## 2021-03-30 DIAGNOSIS — I2510 Atherosclerotic heart disease of native coronary artery without angina pectoris: Secondary | ICD-10-CM

## 2021-03-30 DIAGNOSIS — I10 Essential (primary) hypertension: Secondary | ICD-10-CM

## 2021-03-30 DIAGNOSIS — I739 Peripheral vascular disease, unspecified: Secondary | ICD-10-CM

## 2021-03-30 DIAGNOSIS — E782 Mixed hyperlipidemia: Secondary | ICD-10-CM

## 2021-03-30 DIAGNOSIS — Z720 Tobacco use: Secondary | ICD-10-CM

## 2021-03-30 DIAGNOSIS — I255 Ischemic cardiomyopathy: Secondary | ICD-10-CM

## 2021-03-30 DIAGNOSIS — Z794 Long term (current) use of insulin: Secondary | ICD-10-CM

## 2021-03-30 DIAGNOSIS — E1143 Type 2 diabetes mellitus with diabetic autonomic (poly)neuropathy: Secondary | ICD-10-CM

## 2021-03-30 DIAGNOSIS — I252 Old myocardial infarction: Secondary | ICD-10-CM | POA: Diagnosis not present

## 2021-03-30 NOTE — Progress Notes (Signed)
Cardiology Office Note:    Date:  03/30/2021   ID:  Gabriel Bean, DOB 1955/04/07, MRN [id number]  PCP:  Lajean Manes, MD  Cardiologist:  Buford Dresser, MD  Referring MD: Lajean Manes, MD   CC: follow up  History of Present Illness:    Gabriel Bean is a 66 y.o. male with a hx of CAD with NSTEMI 06/2019, ischemic cardiomyopathy, type II diabetes on insulin pump, hyperlipidemia, hypertension, tobacco use, OSA on CPAP who is seen for follow up today. I met him during his hospitalization for NSTEMI 06/2019.  Today: Overall, he is feeling good. However, he continues to suffer from neuropathy in his feet and fingertips. His feet tend to heal slowly as well.  He saw Dr. Chalmers Cater 02/22/2021, and was told he may have a possible blockage in his left foot. He has noticed that his left LE appears more erythematous and discolored than his right LE.  Since beginning 200 mg Alpha-Lipoic acid, his orthostatic hypotension and syncope has resolved. Every now and then he may become a little lightheaded.  He states his breathing is as good as it ever is. Lately he is smoking about 1 ppd.  Three weeks ago he was ill with the Flu. He has since recovered.  He denies any palpitations, or chest pain. No headaches, syncope, orthopnea, PND, lower extremity edema or exertional symptoms. No hematuria or hematochezia.   Past Medical History:  Diagnosis Date   Chronic rhinitis    Coronary artery disease    GERD (gastroesophageal reflux disease)    History of skin cancer    Hypertension    Low libido    OCD (obsessive compulsive disorder)    OSA (obstructive sleep apnea)    Seasonal allergies     Past Surgical History:  Procedure Laterality Date   APPENDECTOMY     COLONOSCOPY     x3   CORONARY STENT INTERVENTION N/A 07/01/2019   Procedure: CORONARY STENT INTERVENTION;  Surgeon: Troy Sine, MD;  Location: [facility];  Service: Cardiovascular;  Laterality: N/A;  LAD   LEFT  HEART CATH AND CORONARY ANGIOGRAPHY N/A 07/01/2019   Procedure: LEFT HEART CATH AND CORONARY ANGIOGRAPHY;  Surgeon: Troy Sine, MD;  Location: [facility];  Service: Cardiovascular;  Laterality: N/A;   SKIN CANCER EXCISION     TONSILLECTOMY     WISDOM TOOTH EXTRACTION      Current Medications: Current Outpatient Medications on File Prior to Visit  Medication Sig   allopurinol (ZYLOPRIM) 300 MG tablet Take 300 mg by mouth daily.   Alpha-Lipoic Acid 200 MG CAPS Take 200 mg by mouth daily in the afternoon.   aspirin 81 MG chewable tablet Chew 1 tablet (81 mg total) by mouth daily.   buPROPion (WELLBUTRIN XL) 300 MG 24 hr tablet Take 300 mg by mouth daily.    carvedilol (COREG) 12.5 MG tablet Take 1 tablet (12.5 mg total) by mouth 2 (two) times daily with a meal.   clopidogrel (PLAVIX) 75 MG tablet Take 1 tablet (75 mg total) by mouth daily.   Continuous Blood Gluc Sensor (FREESTYLE LIBRE 14 DAY SENSOR) MISC SMARTSIG:1 Each Topical Every 2 Weeks   DULoxetine (CYMBALTA) 60 MG capsule Take 120 mg by mouth daily.   empagliflozin (JARDIANCE) 25 MG TABS tablet Take 25 mg by mouth daily.   ENTRESTO 24-26 MG TAKE 1 TABLET BY MOUTH TWICE DAILY   ezetimibe (ZETIA) 10 MG tablet Take 10 mg by mouth daily.  Insulin Human (INSULIN PUMP) SOLN Inject into the skin continuous. novolog   NOVOLOG 100 UNIT/ML injection SMARTSIG:100 Unit(s) SUB-Q Daily   rosuvastatin (CRESTOR) 5 MG tablet TAKE 1 TABLET BY MOUTH 3-5 TIMES WEEKLY AS TOLERATED   Zinc 50 MG CAPS Take 50 mg by mouth daily in the afternoon.   No current facility-administered medications on file prior to visit.     Allergies:   Patient has no known allergies.   Social History   Tobacco Use   Smoking status: Every Day    Packs/day: 1.00    Types: Cigarettes   Smokeless tobacco: Former    Types: Snuff   Tobacco comments:    started smoking at age 28.    Vaping Use   Vaping Use: Never used  Substance Use Topics   Alcohol use:  Yes    Comment: 5 drinks per day   Drug use: No    Family History: family history includes Cancer in his father; Colon cancer in his father; Heart disease in his brother and father; Other in his mother; Skin cancer in his father.  ROS:   Please see the history of present illness.   (+) Neuropathy (+) Lightheadeness Additional pertinent ROS otherwise unremarkable.  EKGs/Labs/Other Studies Reviewed:    The following studies were reviewed today:  CT Chest 11/22/2020: COMPARISON:  11/12/2019.   FINDINGS: Cardiovascular: Atherosclerotic calcification of the aorta and coronary arteries. Heart size normal. No pericardial effusion.   Mediastinum/Nodes: No pathologically enlarged mediastinal or axillary lymph nodes. Hilar regions are difficult to evaluate without IV contrast but appear grossly unremarkable. Esophagus is grossly unremarkable.   Lungs/Pleura: Biapical pleural-parenchymal scarring. Centrilobular and paraseptal emphysema. Smoking related respiratory bronchiolitis. Pulmonary nodules otherwise measure 3.6 mm or less in size, as before. No new pulmonary nodules. No pleural fluid. Airway is unremarkable.   Upper Abdomen: Visualized portions of the liver, gallbladder and adrenal glands are unremarkable. 2.5 cm low-density lesion off the upper pole left kidney, consistent with a cyst. Smaller exophytic lesion off the anterior interpolar right kidney is incompletely visualized. Visualized portions of the kidneys, spleen, pancreas, stomach and bowel are otherwise unremarkable.   Musculoskeletal: Degenerative changes in the spine. No worrisome lytic or sclerotic lesions.   IMPRESSION: 1. Lung-RADS 2, benign appearance or behavior. Continue annual screening with low-dose chest CT without contrast in 12 months. 2. Aortic atherosclerosis (ICD10-I70.0). Coronary artery calcification. 3.  Emphysema (ICD10-J43.9).  Echo 10/05/2019:  1. Left ventricular ejection fraction, by  estimation, is 50 to 55%. The  left ventricle has low normal function. The left ventricle demonstrates  regional wall motion abnormalities (see scoring diagram/findings for  description). Mid to apical anteroseptal   hypokinesis. Apex not well visualized but appears hypokinetic. Left  ventricular diastolic parameters are indeterminate.   2. Right ventricular systolic function is normal. The right ventricular  size is normal. There is normal pulmonary artery systolic pressure. The  estimated right ventricular systolic pressure is 40.9 mmHg.   3. The mitral valve is normal in structure. Mild mitral valve  regurgitation.   4. The aortic valve is tricuspid. Aortic valve regurgitation is not  visualized. Mild aortic valve sclerosis is present, with no evidence of  aortic valve stenosis.   5. The inferior vena cava is normal in size with greater than 50%  respiratory variability, suggesting right atrial pressure of 3 mmHg.   LHC 07/01/19:   Prox RCA to Mid RCA lesion is 60% stenosed. Prox LAD to Mid LAD lesion is 100% stenosed.  Post intervention, there is a 0% residual stenosis. A stent was successfully placed.   Total occlusion of the proximal LAD with initial TIMI 0 flow and faint septal collateralization via the distal RCA.   Normal left circumflex coronary artery.     Dominant RCA with 60% mid RCA stenosis..   Low normal to mildly impaired LV function with EF estimate at 50%; LVEDP 16 mmHg.   PCI to the LAD with ultimate insertion of a 3.0 x 26 mm Resolute Onyx DES stent postdilated to 3.25 mm with the 100% occlusion being reduced to 0% and TIMI 0 flow being improved to TIMI-3 flow.   RECOMMENDATION: DAPT for minimum of 1 year.  Smoking cessation is essential.  Optimal blood pressure control with target blood pressure less than 130/80 and ideal less than 120/80.  Aggressive lipid-lowering therapy with LDL goal less than 70.      Echocardiogram 07/01/19:  1. Left ventricular  ejection fraction, by estimation, is 35 to 40%. The  left ventricle has moderately decreased function. The left ventricle  demonstrates regional wall motion abnormalities (see scoring  diagram/findings for description). The left  ventricular internal cavity size was mildly dilated. Left ventricular  diastolic parameters are consistent with Grade I diastolic dysfunction  (impaired relaxation). Elevated left atrial pressure.   2. Right ventricular systolic function is normal. The right ventricular  size is normal.   3. The mitral valve is normal in structure. No evidence of mitral valve  regurgitation. No evidence of mitral stenosis.   4. The aortic valve is normal in structure. Aortic valve regurgitation is  not visualized. No aortic stenosis is present.   5. The inferior vena cava is normal in size with greater than 50%  respiratory variability, suggesting right atrial pressure of 3 mmHg.   EKG:  EKG is personally reviewed.   03/30/2021: NSR at 86 bpm, possible old septal infarct 07/12/19: NSR, 85 bpm, stable ST-T wave changes  Recent Labs: No results found for requested labs within last 8760 hours.   Recent Lipid Panel    Component Value Date/Time   CHOL 132 08/23/2019 1434   TRIG 70 08/23/2019 1434   HDL 65 08/23/2019 1434   CHOLHDL 2.0 08/23/2019 1434   CHOLHDL 3.0 07/01/2019 0343   VLDL 10 07/01/2019 0343   LDLCALC 53 08/23/2019 1434    Physical Exam:    VS:  BP 126/84 (BP Location: Left Arm, Patient Position: Sitting, Cuff Size: Large)    Pulse 86    Ht 6' (1.829 m)    Wt 253 lb 6.4 oz (114.9 kg)    SpO2 98%    BMI 34.37 kg/m    No data found.  Wt Readings from Last 3 Encounters:  03/30/21 253 lb 6.4 oz (114.9 kg)  08/08/20 245 lb 9.6 oz (111.4 kg)  01/31/20 242 lb (109.8 kg)    GEN: Well nourished, well developed in no acute distress HEENT: Normal, moist mucous membranes NECK: No JVD CARDIAC: regular rhythm, normal S1 and S2, no rubs or gallops. No  murmur. VASCULAR: Radial pulses 2+ bilaterally. DP pulses palpable bilaterally RESPIRATORY:  Clear to auscultation without rales, wheezing or rhonchi  ABDOMEN: Soft, non-tender, non-distended MUSCULOSKELETAL:  Ambulates independently SKIN: Warm and dry, no edema. Mild discoloration of LLE NEUROLOGIC:  Alert and oriented x 3. No focal neuro deficits noted. PSYCHIATRIC:  Normal affect   ASSESSMENT:    1. PAD (peripheral artery disease) ([place])   2. Coronary artery disease involving native coronary artery  of native heart without angina pectoris   3. History of non-ST elevation myocardial infarction (NSTEMI)   4. Ischemic cardiomyopathy   5. Essential hypertension   6. Tobacco abuse   7. Mixed hyperlipidemia   8. Type 2 diabetes mellitus with diabetic autonomic neuropathy, with long-term current use of insulin (HCC)     PLAN:    Concern for PAD: he reports that he intermittently has wounds that take long to heal. He has all the risk factors for PAD. Will get ABIs for further evaluation.   Frequent near syncope/lightheadedness and syncope: -resolved with started ALA supplements  NSTEMI (borderline STEMI), with newly diagnosed CAD, s/p PCI to LAD 06/2019:  -dual antiplatelet long term with aspirin and clopidogrel -continue carvedilol -no angina  Hypercholesterolemia:  -on rosuvastatin 5 mg three times/week -last LDL 44 10/2020  Type II diabetes, with significant long term neuropathy -on insulin pump and Jardiance  Ischemic cardiomyopathy:  -on carvedilol, entresto, Jardiance -EF improved on recent echo to 50-55%  Tobacco cessation: we discussed this. He was honest that he derives enjoyment from smoking and is not interested in quitting. He understands the dangers of smoking especially with vascular disease  Cardiac risk counseling and prevention recommendations: -recommend heart healthy/Mediterranean diet, with whole grains, fruits, vegetable, fish, lean meats, nuts, and olive  oil. Limit salt. -recommend moderate walking, 3-5 times/week for 30-50 minutes each session. Aim for at least 150 minutes.week. Goal should be pace of 3 miles/hours, or walking 1.5 miles in 30 minutes -recommend avoidance of tobacco products. Avoid excess alcohol.  Plan for follow up: 6 months or sooner as needed  Buford Dresser, MD, PhD [place]   [facility]   Medication Adjustments/Labs and Tests Ordered: Current medicines are reviewed at length with the patient today.  Concerns regarding medicines are outlined above.   Orders Placed This Encounter  Procedures   EKG 12-Lead   VAS [place] ABI WITH/WO TBI   No orders of the defined types were placed in this encounter.  Patient Instructions  Medication Instructions:  Your Physician recommend you continue on your current medication as directed.    *If you need a refill on your cardiac medications before your next appointment, please call your pharmacy*   Lab Work: None ordered today   Testing/Procedures: Your physician has requested that you have an ankle brachial index (ABI). During this test an ultrasound and blood pressure cuff are used to evaluate the arteries that supply the arms and legs with blood. Allow thirty minutes for this exam. There are no restrictions or special instructions. [place]   Follow-Up: At [facility], you and your health needs are our priority.  As part of our continuing mission to provide you with exceptional heart care, we have created designated Provider Care Teams.  These Care Teams include your primary Cardiologist (physician) and Advanced Practice Providers (APPs -  Physician Assistants and Nurse Practitioners) who all work together to provide you with the care you need, when you need it.  We recommend signing up for the patient portal called "MyChart".  Sign up information is provided on this After Visit Summary.  MyChart is used to connect with patients for  Virtual Visits (Telemedicine).  Patients are able to view lab/test results, encounter notes, upcoming appointments, etc.  Non-urgent messages can be sent to your provider as well.   To learn more about what you can do with MyChart, go to [email or web address].    Your next appointment:   6  month(s)  The format for your next appointment:   In Person  Provider:   Buford Dresser, MD{         I,Mathew Stumpf,acting as a scribe for Buford Dresser, MD.,have documented all relevant documentation on the behalf of Buford Dresser, MD,as directed by  Buford Dresser, MD while in the presence of Buford Dresser, MD.  I, Buford Dresser, MD, have reviewed all documentation for this visit. The documentation on 03/30/21 for the exam, diagnosis, procedures, and orders are all accurate and complete.   Signed, Buford Dresser, MD PhD 03/30/2021  [place]

## 2021-03-30 NOTE — Patient Instructions (Signed)
Medication Instructions:  Your Physician recommend you continue on your current medication as directed.    *If you need a refill on your cardiac medications before your next appointment, please call your pharmacy*   Lab Work: None ordered today   Testing/Procedures: Your physician has requested that you have an ankle brachial index (ABI). During this test an ultrasound and blood pressure cuff are used to evaluate the arteries that supply the arms and legs with blood. Allow thirty minutes for this exam. There are no restrictions or special instructions. [address]   Follow-Up: At [facility], you and your health needs are our priority.  As part of our continuing mission to provide you with exceptional heart care, we have created designated Provider Care Teams.  These Care Teams include your primary Cardiologist (physician) and Advanced Practice Providers (APPs -  Physician Assistants and Nurse Practitioners) who all work together to provide you with the care you need, when you need it.  We recommend signing up for the patient portal called "MyChart".  Sign up information is provided on this After Visit Summary.  MyChart is used to connect with patients for Virtual Visits (Telemedicine).  Patients are able to view lab/test results, encounter notes, upcoming appointments, etc.  Non-urgent messages can be sent to your provider as well.   To learn more about what you can do with MyChart, go to [email or web address].    Your next appointment:   6 month(s)  The format for your next appointment:   In Person  Provider:   Jodelle Red, MD{

## 2021-04-05 ENCOUNTER — Other Ambulatory Visit (HOSPITAL_BASED_OUTPATIENT_CLINIC_OR_DEPARTMENT_OTHER): Payer: Self-pay | Admitting: Cardiology

## 2021-04-05 DIAGNOSIS — I739 Peripheral vascular disease, unspecified: Secondary | ICD-10-CM

## 2021-04-09 ENCOUNTER — Ambulatory Visit (INDEPENDENT_AMBULATORY_CARE_PROVIDER_SITE_OTHER): Payer: Federal, State, Local not specified - PPO

## 2021-04-09 ENCOUNTER — Other Ambulatory Visit: Payer: Self-pay

## 2021-04-09 DIAGNOSIS — I739 Peripheral vascular disease, unspecified: Secondary | ICD-10-CM | POA: Diagnosis not present

## 2021-04-23 DIAGNOSIS — Z0000 Encounter for general adult medical examination without abnormal findings: Secondary | ICD-10-CM | POA: Diagnosis not present

## 2021-04-23 DIAGNOSIS — Z125 Encounter for screening for malignant neoplasm of prostate: Secondary | ICD-10-CM | POA: Diagnosis not present

## 2021-04-23 DIAGNOSIS — Z23 Encounter for immunization: Secondary | ICD-10-CM | POA: Diagnosis not present

## 2021-05-25 ENCOUNTER — Other Ambulatory Visit: Payer: Self-pay | Admitting: Cardiology

## 2021-07-05 ENCOUNTER — Other Ambulatory Visit: Payer: Self-pay | Admitting: Cardiology

## 2021-07-05 NOTE — Telephone Encounter (Signed)
Rx(s) sent to pharmacy electronically.  

## 2021-08-27 DIAGNOSIS — I10 Essential (primary) hypertension: Secondary | ICD-10-CM | POA: Diagnosis not present

## 2021-08-27 DIAGNOSIS — Z9641 Presence of insulin pump (external) (internal): Secondary | ICD-10-CM | POA: Diagnosis not present

## 2021-08-27 DIAGNOSIS — E7800 Pure hypercholesterolemia, unspecified: Secondary | ICD-10-CM | POA: Diagnosis not present

## 2021-08-27 DIAGNOSIS — E1165 Type 2 diabetes mellitus with hyperglycemia: Secondary | ICD-10-CM | POA: Diagnosis not present

## 2021-09-26 ENCOUNTER — Other Ambulatory Visit: Payer: Self-pay | Admitting: Cardiology

## 2021-09-27 NOTE — Telephone Encounter (Signed)
Rx request sent to pharmacy.  

## 2021-10-29 ENCOUNTER — Encounter (HOSPITAL_BASED_OUTPATIENT_CLINIC_OR_DEPARTMENT_OTHER): Payer: Self-pay | Admitting: *Deleted

## 2021-10-29 DIAGNOSIS — Z794 Long term (current) use of insulin: Secondary | ICD-10-CM | POA: Insufficient documentation

## 2021-10-29 DIAGNOSIS — Z9641 Presence of insulin pump (external) (internal): Secondary | ICD-10-CM | POA: Insufficient documentation

## 2021-10-29 DIAGNOSIS — E1165 Type 2 diabetes mellitus with hyperglycemia: Secondary | ICD-10-CM | POA: Insufficient documentation

## 2021-10-29 DIAGNOSIS — G629 Polyneuropathy, unspecified: Secondary | ICD-10-CM | POA: Insufficient documentation

## 2021-10-29 DIAGNOSIS — E7800 Pure hypercholesterolemia, unspecified: Secondary | ICD-10-CM | POA: Insufficient documentation

## 2021-10-30 DIAGNOSIS — H35033 Hypertensive retinopathy, bilateral: Secondary | ICD-10-CM | POA: Diagnosis not present

## 2021-10-30 DIAGNOSIS — E1142 Type 2 diabetes mellitus with diabetic polyneuropathy: Secondary | ICD-10-CM | POA: Diagnosis not present

## 2021-10-30 DIAGNOSIS — F17210 Nicotine dependence, cigarettes, uncomplicated: Secondary | ICD-10-CM | POA: Diagnosis not present

## 2021-10-30 DIAGNOSIS — L989 Disorder of the skin and subcutaneous tissue, unspecified: Secondary | ICD-10-CM | POA: Diagnosis not present

## 2021-10-31 ENCOUNTER — Other Ambulatory Visit: Payer: Self-pay | Admitting: Geriatric Medicine

## 2021-10-31 DIAGNOSIS — F17210 Nicotine dependence, cigarettes, uncomplicated: Secondary | ICD-10-CM

## 2021-11-05 ENCOUNTER — Ambulatory Visit (HOSPITAL_BASED_OUTPATIENT_CLINIC_OR_DEPARTMENT_OTHER): Payer: Federal, State, Local not specified - PPO | Admitting: Cardiology

## 2021-11-05 ENCOUNTER — Encounter (HOSPITAL_BASED_OUTPATIENT_CLINIC_OR_DEPARTMENT_OTHER): Payer: Self-pay | Admitting: Cardiology

## 2021-11-05 VITALS — BP 104/72 | HR 84 | Ht 72.0 in | Wt 246.0 lb

## 2021-11-05 DIAGNOSIS — I2510 Atherosclerotic heart disease of native coronary artery without angina pectoris: Secondary | ICD-10-CM

## 2021-11-05 DIAGNOSIS — E118 Type 2 diabetes mellitus with unspecified complications: Secondary | ICD-10-CM

## 2021-11-05 DIAGNOSIS — I255 Ischemic cardiomyopathy: Secondary | ICD-10-CM

## 2021-11-05 DIAGNOSIS — Z720 Tobacco use: Secondary | ICD-10-CM

## 2021-11-05 DIAGNOSIS — E782 Mixed hyperlipidemia: Secondary | ICD-10-CM | POA: Diagnosis not present

## 2021-11-05 DIAGNOSIS — I252 Old myocardial infarction: Secondary | ICD-10-CM

## 2021-11-05 DIAGNOSIS — I10 Essential (primary) hypertension: Secondary | ICD-10-CM

## 2021-11-05 DIAGNOSIS — Z794 Long term (current) use of insulin: Secondary | ICD-10-CM

## 2021-11-05 NOTE — Progress Notes (Signed)
Cardiology Office Note:    Date:  11/05/2021   ID:  RYKIN ROUTE, DOB 10-20-1955, MRN [id number]  PCP:  Lajean Manes, MD  Cardiologist:  Buford Dresser, MD  Referring MD: Lajean Manes, MD   CC: follow up  History of Present Illness:    Gabriel Bean is a 66 y.o. male with a hx of CAD with NSTEMI 06/2019, ischemic cardiomyopathy, type II diabetes on insulin pump, hyperlipidemia, hypertension, tobacco use, OSA on CPAP who is seen for follow up today. I met him during his hospitalization for NSTEMI 06/2019.  At his last appointment he continued to struggle with neuropathy in his feet and fingertips. He saw Dr. Chalmers Cater 02/22/2021, and was told he may have a possible blockage in his left foot. He had noticed that his LLE appeared more erythematous and discolored than his RLE. Since beginning 200 mg Alpha-Lipoic acid, his orthostatic hypotension and syncope had resolved. He was smoking about 1 ppd and not interested in quitting.  Today: He reports feeling fine today with no new cardiovascular concerns. He endorses well controlled blood pressures at home and other clinic visits.  No recent chest pain. His lightheadedness has improved. His endocrinologist started him on Alpha-Lipoic acid which has helped him significantly.  In the past month his most strenuous activity has been cleaning the house. Typically he does not participate in formal exercise.  He continues to smoke about 1 ppd. He has been pondering about quitting; when he is ready he plans to use nicotine gum.  The other day he had a filling, and had to wait 30 minutes for the bleeding to stop. He states that he will need to have a molar removed.  He denies any palpitations, shortness of breath, or peripheral edema. No headaches, syncope, orthopnea, or PND.   Past Medical History:  Diagnosis Date   Chronic rhinitis    Coronary artery disease involving native coronary artery of native heart without angina pectoris  05/07/2020   GERD (gastroesophageal reflux disease)    HFrEF (heart failure with reduced ejection fraction) ([facility]) 08/18/2019   History of skin cancer    HTN (hypertension) 07/02/2019   Ischemic cardiomyopathy 05/07/2020   Low libido    OCD (obsessive compulsive disorder)    OSA (obstructive sleep apnea)    Seasonal allergies     Past Surgical History:  Procedure Laterality Date   APPENDECTOMY     COLONOSCOPY     x3   CORONARY STENT INTERVENTION N/A 07/01/2019   Procedure: CORONARY STENT INTERVENTION;  Surgeon: Troy Sine, MD;  Location: [facility];  Service: Cardiovascular;  Laterality: N/A;  LAD   LEFT HEART CATH AND CORONARY ANGIOGRAPHY N/A 07/01/2019   Procedure: LEFT HEART CATH AND CORONARY ANGIOGRAPHY;  Surgeon: Troy Sine, MD;  Location: [facility];  Service: Cardiovascular;  Laterality: N/A;   SKIN CANCER EXCISION     TONSILLECTOMY     WISDOM TOOTH EXTRACTION      Current Medications: Current Outpatient Medications on File Prior to Visit  Medication Sig   allopurinol (ZYLOPRIM) 300 MG tablet Take 300 mg by mouth daily.   Alpha-Lipoic Acid 200 MG CAPS Take 200 mg by mouth daily in the afternoon.   aspirin 81 MG chewable tablet Chew 1 tablet (81 mg total) by mouth daily.   B Complex CAPS See admin instructions.   buPROPion (WELLBUTRIN XL) 300 MG 24 hr tablet Take 300 mg by mouth daily.    carvedilol (COREG) 12.5  MG tablet Take 1 tablet (12.5 mg total) by mouth 2 (two) times daily with a meal.   clopidogrel (PLAVIX) 75 MG tablet TAKE 1 TABLET(75 MG) BY MOUTH DAILY   Continuous Blood Gluc Sensor (FREESTYLE LIBRE 14 DAY SENSOR) MISC SMARTSIG:1 Each Topical Every 2 Weeks   DULoxetine (CYMBALTA) 60 MG capsule Take 120 mg by mouth daily.   empagliflozin (JARDIANCE) 25 MG TABS tablet Take 25 mg by mouth daily.   ENTRESTO 24-26 MG TAKE 1 TABLET BY MOUTH TWICE DAILY   ezetimibe (ZETIA) 10 MG tablet Take 10 mg by mouth daily.   Insulin Human (INSULIN PUMP)  SOLN Inject into the skin continuous. novolog   NOVOLOG 100 UNIT/ML injection SMARTSIG:100 Unit(s) SUB-Q Daily   omeprazole (PRILOSEC) 20 MG capsule Take 20 mg by mouth daily.   rosuvastatin (CRESTOR) 5 MG tablet TAKE 1 TABLET BY MOUTH 3-5 TIMES WEEKLY AS TOLERATED   Zinc 50 MG CAPS Take 50 mg by mouth daily in the afternoon.   No current facility-administered medications on file prior to visit.     Allergies:   Losartan potassium   Social History   Tobacco Use   Smoking status: Every Day    Packs/day: 1.00    Types: Cigarettes   Smokeless tobacco: Former    Types: Snuff   Tobacco comments:    started smoking at age 35.    Vaping Use   Vaping Use: Never used  Substance Use Topics   Alcohol use: Yes    Comment: 5 drinks per day   Drug use: No    Family History: family history includes Cancer in his father; Colon cancer in his father; Heart disease in his brother and father; Other in his mother; Skin cancer in his father.  ROS:   Please see the history of present illness.   Additional pertinent ROS otherwise unremarkable.  EKGs/Labs/Other Studies Reviewed:    The following studies were reviewed today:  LE Doppler Study  04/09/2021: Summary:  Right: Resting right ankle-brachial index is within normal range. No  evidence of significant right lower extremity arterial disease. The right  toe-brachial index is abnormal.   Left: Resting left ankle-brachial index is within normal range. No  evidence of significant left lower extremity arterial disease. The left  toe-brachial index is normal.   CT Chest 11/22/2020: COMPARISON:  11/12/2019.   FINDINGS: Cardiovascular: Atherosclerotic calcification of the aorta and coronary arteries. Heart size normal. No pericardial effusion.   Mediastinum/Nodes: No pathologically enlarged mediastinal or axillary lymph nodes. Hilar regions are difficult to evaluate without IV contrast but appear grossly unremarkable. Esophagus is grossly  unremarkable.   Lungs/Pleura: Biapical pleural-parenchymal scarring. Centrilobular and paraseptal emphysema. Smoking related respiratory bronchiolitis. Pulmonary nodules otherwise measure 3.6 mm or less in size, as before. No new pulmonary nodules. No pleural fluid. Airway is unremarkable.   Upper Abdomen: Visualized portions of the liver, gallbladder and adrenal glands are unremarkable. 2.5 cm low-density lesion off the upper pole left kidney, consistent with a cyst. Smaller exophytic lesion off the anterior interpolar right kidney is incompletely visualized. Visualized portions of the kidneys, spleen, pancreas, stomach and bowel are otherwise unremarkable.   Musculoskeletal: Degenerative changes in the spine. No worrisome lytic or sclerotic lesions.   IMPRESSION: 1. Lung-RADS 2, benign appearance or behavior. Continue annual screening with low-dose chest CT without contrast in 12 months. 2. Aortic atherosclerosis (ICD10-I70.0). Coronary artery calcification. 3.  Emphysema (ICD10-J43.9).  Echo 10/05/2019:  1. Left ventricular ejection fraction, by estimation, is 50  to 55%. The  left ventricle has low normal function. The left ventricle demonstrates  regional wall motion abnormalities (see scoring diagram/findings for  description). Mid to apical anteroseptal   hypokinesis. Apex not well visualized but appears hypokinetic. Left  ventricular diastolic parameters are indeterminate.   2. Right ventricular systolic function is normal. The right ventricular  size is normal. There is normal pulmonary artery systolic pressure. The  estimated right ventricular systolic pressure is 35.7 mmHg.   3. The mitral valve is normal in structure. Mild mitral valve  regurgitation.   4. The aortic valve is tricuspid. Aortic valve regurgitation is not  visualized. Mild aortic valve sclerosis is present, with no evidence of  aortic valve stenosis.   5. The inferior vena cava is normal in size with  greater than 50%  respiratory variability, suggesting right atrial pressure of 3 mmHg.   LHC 07/01/19:   Prox RCA to Mid RCA lesion is 60% stenosed. Prox LAD to Mid LAD lesion is 100% stenosed. Post intervention, there is a 0% residual stenosis. A stent was successfully placed.   Total occlusion of the proximal LAD with initial TIMI 0 flow and faint septal collateralization via the distal RCA.   Normal left circumflex coronary artery.     Dominant RCA with 60% mid RCA stenosis..   Low normal to mildly impaired LV function with EF estimate at 50%; LVEDP 16 mmHg.   PCI to the LAD with ultimate insertion of a 3.0 x 26 mm Resolute Onyx DES stent postdilated to 3.25 mm with the 100% occlusion being reduced to 0% and TIMI 0 flow being improved to TIMI-3 flow.   RECOMMENDATION: DAPT for minimum of 1 year.  Smoking cessation is essential.  Optimal blood pressure control with target blood pressure less than 130/80 and ideal less than 120/80.  Aggressive lipid-lowering therapy with LDL goal less than 70.      Echocardiogram 07/01/19:  1. Left ventricular ejection fraction, by estimation, is 35 to 40%. The  left ventricle has moderately decreased function. The left ventricle  demonstrates regional wall motion abnormalities (see scoring  diagram/findings for description). The left  ventricular internal cavity size was mildly dilated. Left ventricular  diastolic parameters are consistent with Grade I diastolic dysfunction  (impaired relaxation). Elevated left atrial pressure.   2. Right ventricular systolic function is normal. The right ventricular  size is normal.   3. The mitral valve is normal in structure. No evidence of mitral valve  regurgitation. No evidence of mitral stenosis.   4. The aortic valve is normal in structure. Aortic valve regurgitation is  not visualized. No aortic stenosis is present.   5. The inferior vena cava is normal in size with greater than 50%  respiratory  variability, suggesting right atrial pressure of 3 mmHg.   EKG:  EKG is personally reviewed.   11/05/2021:  not ordered today 03/30/2021: NSR at 86 bpm, possible old septal infarct 07/12/19: NSR, 85 bpm, stable ST-T wave changes  Recent Labs: No results found for requested labs within last 365 days.   Recent Lipid Panel    Component Value Date/Time   CHOL 132 08/23/2019 1434   TRIG 70 08/23/2019 1434   HDL 65 08/23/2019 1434   CHOLHDL 2.0 08/23/2019 1434   CHOLHDL 3.0 07/01/2019 0343   VLDL 10 07/01/2019 0343   LDLCALC 53 08/23/2019 1434    Physical Exam:    VS:  BP 104/72   Pulse 84   Ht 6' (1.829 m)  Wt 246 lb (111.6 kg)   BMI 33.36 kg/m    No data found.  Wt Readings from Last 3 Encounters:  11/05/21 246 lb (111.6 kg)  03/30/21 253 lb 6.4 oz (114.9 kg)  08/08/20 245 lb 9.6 oz (111.4 kg)    GEN: Well nourished, well developed in no acute distress HEENT: Normal, moist mucous membranes NECK: No JVD CARDIAC: regular rhythm, normal S1 and S2, no rubs or gallops. No murmur. VASCULAR: Radial pulses 2+ bilaterally. DP pulses palpable bilaterally RESPIRATORY:  Clear to auscultation without rales, wheezing or rhonchi  ABDOMEN: Soft, non-tender, non-distended MUSCULOSKELETAL:  Ambulates independently SKIN: Warm and dry, no edema. NEUROLOGIC:  Alert and oriented x 3. No focal neuro deficits noted. PSYCHIATRIC:  Normal affect   ASSESSMENT:    1. Coronary artery disease involving native coronary artery of native heart without angina pectoris   2. History of non-ST elevation myocardial infarction (NSTEMI)   3. Tobacco abuse   4. Mixed hyperlipidemia   5. Ischemic cardiomyopathy   6. Essential hypertension   7. Type 2 diabetes mellitus with complication, with long-term current use of insulin (HCC)      PLAN:    Frequent near syncope/lightheadedness and syncope: -resolved with started ALA supplements  NSTEMI (borderline STEMI), with newly diagnosed CAD, s/p PCI to  LAD 06/2019:  -dual antiplatelet currently with aspirin and clopidogrel. We discussed dropping to single agent, discussed that recent data suggests clopidogrel may be a better long term single agent than aspirin. For now he feels he is doing well and does not wish to make changes. -continue carvedilol -no angina -prior ABIs without PAD  Hyperlipidemia, mixed  -on rosuvastatin 5 mg three times/week -last LDL 44 and TG 1398/2022, due for repeat labs in December.   Type II diabetes, with significant long term neuropathy -on insulin pump and Jardiance  Ischemic cardiomyopathy:  -on carvedilol, entresto, Jardiance -EF improved on echo to 50-55% -not very active at baseline, but denies symptoms, NYHA class 1  Tobacco cessation: he is precontemplative  Cardiac risk counseling and prevention recommendations: -recommend heart healthy/Mediterranean diet, with whole grains, fruits, vegetable, fish, lean meats, nuts, and olive oil. Limit salt. -recommend moderate walking, 3-5 times/week for 30-50 minutes each session. Aim for at least 150 minutes.week. Goal should be pace of 3 miles/hours, or walking 1.5 miles in 30 minutes -recommend avoidance of tobacco products. Avoid excess alcohol.  Plan for follow up: 1 year or sooner as needed  Buford Dresser, MD, PhD [place]  [facility]   Medication Adjustments/Labs and Tests Ordered: Current medicines are reviewed at length with the patient today.  Concerns regarding medicines are outlined above.   No orders of the defined types were placed in this encounter.  No orders of the defined types were placed in this encounter.  Patient Instructions  Medication Instructions:  Your Physician recommend you continue on your current medication as directed.    *If you need a refill on your cardiac medications before your next appointment, please call your pharmacy*   Lab Work: None ordered today   Testing/Procedures: None ordered  today   Follow-Up: At [facility], you and your health needs are our priority.  As part of our continuing mission to provide you with exceptional heart care, we have created designated Provider Care Teams.  These Care Teams include your primary Cardiologist (physician) and Advanced Practice Providers (APPs -  Physician Assistants and Nurse Practitioners) who all work together to provide you with the care you  need, when you need it.  We recommend signing up for the patient portal called "MyChart".  Sign up information is provided on this After Visit Summary.  MyChart is used to connect with patients for Virtual Visits (Telemedicine).  Patients are able to view lab/test results, encounter notes, upcoming appointments, etc.  Non-urgent messages can be sent to your provider as well.   To learn more about what you can do with MyChart, go to [email or web address].    Your next appointment:   1 year(s)  The format for your next appointment:   In Person  Provider:   Buford Dresser, MD{          I,Mathew Stumpf,acting as a scribe for Buford Dresser, MD.,have documented all relevant documentation on the behalf of Buford Dresser, MD,as directed by  Buford Dresser, MD while in the presence of Buford Dresser, MD.  I, Buford Dresser, MD, have reviewed all documentation for this visit. The documentation on 11/05/21 for the exam, diagnosis, procedures, and orders are all accurate and complete.   Signed, Buford Dresser, MD PhD 11/05/2021  [place]

## 2021-11-05 NOTE — Patient Instructions (Signed)

## 2021-11-27 ENCOUNTER — Ambulatory Visit
Admission: RE | Admit: 2021-11-27 | Discharge: 2021-11-27 | Disposition: A | Discharge status: Home / Self Care | Payer: Federal, State, Local not specified - PPO | Source: Ambulatory Visit | Attending: Geriatric Medicine | Admitting: Geriatric Medicine

## 2021-11-27 DIAGNOSIS — F17210 Nicotine dependence, cigarettes, uncomplicated: Secondary | ICD-10-CM

## 2021-12-14 DIAGNOSIS — X32XXXA Exposure to sunlight, initial encounter: Secondary | ICD-10-CM | POA: Diagnosis not present

## 2021-12-14 DIAGNOSIS — L570 Actinic keratosis: Secondary | ICD-10-CM | POA: Diagnosis not present

## 2021-12-14 DIAGNOSIS — Z1283 Encounter for screening for malignant neoplasm of skin: Secondary | ICD-10-CM | POA: Diagnosis not present

## 2021-12-14 DIAGNOSIS — D0461 Carcinoma in situ of skin of right upper limb, including shoulder: Secondary | ICD-10-CM | POA: Diagnosis not present

## 2021-12-14 DIAGNOSIS — L821 Other seborrheic keratosis: Secondary | ICD-10-CM | POA: Diagnosis not present

## 2021-12-21 DIAGNOSIS — Z23 Encounter for immunization: Secondary | ICD-10-CM | POA: Diagnosis not present

## 2022-01-25 DIAGNOSIS — L570 Actinic keratosis: Secondary | ICD-10-CM | POA: Diagnosis not present

## 2022-01-25 DIAGNOSIS — X32XXXD Exposure to sunlight, subsequent encounter: Secondary | ICD-10-CM | POA: Diagnosis not present

## 2022-01-25 DIAGNOSIS — D0461 Carcinoma in situ of skin of right upper limb, including shoulder: Secondary | ICD-10-CM | POA: Diagnosis not present

## 2022-02-11 ENCOUNTER — Encounter (HOSPITAL_BASED_OUTPATIENT_CLINIC_OR_DEPARTMENT_OTHER): Payer: Self-pay

## 2022-02-11 MED ORDER — CARVEDILOL 12.5 MG PO TABS: 12.5000 mg | ORAL_TABLET | Freq: Two times a day (BID) | ORAL | 3 refills | Status: DC

## 2022-02-12 DIAGNOSIS — H53143 Visual discomfort, bilateral: Secondary | ICD-10-CM | POA: Diagnosis not present

## 2022-02-19 DIAGNOSIS — E7800 Pure hypercholesterolemia, unspecified: Secondary | ICD-10-CM | POA: Diagnosis not present

## 2022-02-19 DIAGNOSIS — E1165 Type 2 diabetes mellitus with hyperglycemia: Secondary | ICD-10-CM | POA: Diagnosis not present

## 2022-02-19 DIAGNOSIS — G629 Polyneuropathy, unspecified: Secondary | ICD-10-CM | POA: Diagnosis not present

## 2022-02-26 DIAGNOSIS — I10 Essential (primary) hypertension: Secondary | ICD-10-CM | POA: Diagnosis not present

## 2022-02-26 DIAGNOSIS — E7800 Pure hypercholesterolemia, unspecified: Secondary | ICD-10-CM | POA: Diagnosis not present

## 2022-02-26 DIAGNOSIS — E1165 Type 2 diabetes mellitus with hyperglycemia: Secondary | ICD-10-CM | POA: Diagnosis not present

## 2022-02-26 DIAGNOSIS — Z9641 Presence of insulin pump (external) (internal): Secondary | ICD-10-CM | POA: Diagnosis not present

## 2022-03-21 ENCOUNTER — Other Ambulatory Visit: Payer: Self-pay | Admitting: Cardiology

## 2022-03-21 NOTE — Telephone Encounter (Signed)
Rx request sent to pharmacy.  

## 2022-03-22 DIAGNOSIS — L570 Actinic keratosis: Secondary | ICD-10-CM | POA: Diagnosis not present

## 2022-03-22 DIAGNOSIS — Z85828 Personal history of other malignant neoplasm of skin: Secondary | ICD-10-CM | POA: Diagnosis not present

## 2022-03-22 DIAGNOSIS — Z08 Encounter for follow-up examination after completed treatment for malignant neoplasm: Secondary | ICD-10-CM | POA: Diagnosis not present

## 2022-03-22 DIAGNOSIS — X32XXXD Exposure to sunlight, subsequent encounter: Secondary | ICD-10-CM | POA: Diagnosis not present

## 2022-04-03 ENCOUNTER — Other Ambulatory Visit: Payer: Self-pay | Admitting: Cardiology

## 2022-06-07 ENCOUNTER — Emergency Department (HOSPITAL_BASED_OUTPATIENT_CLINIC_OR_DEPARTMENT_OTHER): Payer: Federal, State, Local not specified - PPO | Admitting: Radiology

## 2022-06-07 ENCOUNTER — Encounter (HOSPITAL_BASED_OUTPATIENT_CLINIC_OR_DEPARTMENT_OTHER): Payer: Self-pay

## 2022-06-07 ENCOUNTER — Emergency Department (HOSPITAL_BASED_OUTPATIENT_CLINIC_OR_DEPARTMENT_OTHER)
Admission: EM | Admit: 2022-06-07 | Discharge: 2022-06-07 | Disposition: A | Discharge status: Home / Self Care | Payer: Federal, State, Local not specified - PPO | Attending: Emergency Medicine | Admitting: Emergency Medicine

## 2022-06-07 ENCOUNTER — Other Ambulatory Visit: Payer: Self-pay

## 2022-06-07 DIAGNOSIS — E1140 Type 2 diabetes mellitus with diabetic neuropathy, unspecified: Secondary | ICD-10-CM | POA: Insufficient documentation

## 2022-06-07 DIAGNOSIS — S61215A Laceration without foreign body of left ring finger without damage to nail, initial encounter: Secondary | ICD-10-CM | POA: Insufficient documentation

## 2022-06-07 DIAGNOSIS — S61213A Laceration without foreign body of left middle finger without damage to nail, initial encounter: Secondary | ICD-10-CM | POA: Insufficient documentation

## 2022-06-07 DIAGNOSIS — Z23 Encounter for immunization: Secondary | ICD-10-CM | POA: Diagnosis not present

## 2022-06-07 DIAGNOSIS — Z7982 Long term (current) use of aspirin: Secondary | ICD-10-CM | POA: Diagnosis not present

## 2022-06-07 DIAGNOSIS — W269XXA Contact with unspecified sharp object(s), initial encounter: Secondary | ICD-10-CM | POA: Diagnosis not present

## 2022-06-07 DIAGNOSIS — Z7902 Long term (current) use of antithrombotics/antiplatelets: Secondary | ICD-10-CM | POA: Insufficient documentation

## 2022-06-07 DIAGNOSIS — Z794 Long term (current) use of insulin: Secondary | ICD-10-CM | POA: Insufficient documentation

## 2022-06-07 DIAGNOSIS — S61412A Laceration without foreign body of left hand, initial encounter: Secondary | ICD-10-CM | POA: Diagnosis not present

## 2022-06-07 DIAGNOSIS — M7989 Other specified soft tissue disorders: Secondary | ICD-10-CM | POA: Diagnosis not present

## 2022-06-07 MED ORDER — TETANUS-DIPHTH-ACELL PERTUSSIS 5-2.5-18.5 LF-MCG/0.5 IM SUSY
0.5000 mL | PREFILLED_SYRINGE | Freq: Once | INTRAMUSCULAR | Status: AC
  Administered 2022-06-07: 0.5 mL via INTRAMUSCULAR
  Filled 2022-06-07: qty 0.5

## 2022-06-07 MED ORDER — LIDOCAINE HCL (PF) 1 % IJ SOLN
5.0000 mL | Freq: Once | INTRAMUSCULAR | Status: DC
  Filled 2022-06-07: qty 5

## 2022-06-07 MED ORDER — SULFAMETHOXAZOLE-TRIMETHOPRIM 800-160 MG PO TABS: 1.0000 | ORAL_TABLET | Freq: Two times a day (BID) | ORAL | 0 refills | Status: AC

## 2022-06-07 NOTE — Discharge Instructions (Addendum)
Please pick up the antibiotics I prescribed for you and please call the hand specialist early tomorrow morning for an appointment regarding your hand and recent ER visit.  Please keep wound clean and have you begin to notice worsening symptoms please return to the ER.

## 2022-06-07 NOTE — ED Triage Notes (Signed)
POV from home, A&O x 4, amb to triage, GCS 15  Sts that he has cut to left hand that happened yesterday to left middle and ring finger, doesn't know what he cut it on due to neuropathy, sts that he did not feel the cut when it happened. Pt sts that swelling began in hand today that made pain worse.

## 2022-06-07 NOTE — ED Provider Notes (Signed)
[facility] Provider Note   CSN: [id number] Arrival date & time: 06/07/22  1909     History  Chief Complaint  Patient presents with   Extremity Laceration    Gabriel Bean is a 67 y.o. male.  Who presented after having a laceration to his left third and fourth digit yesterday.  Patient has diabetic neuropathy and did not notice the consult today since he noticed that his left hand was swelling.  Patient states over the move and feel in his left hand and that the swelling is slightly uncomfortable.  Patient denied any skin color changes.  Patient denied fevers, shortness of breath, chest pain, abdominal pain, change in sensation/motor skills, overlying skin color changes  Home Medications Prior to Admission medications   Medication Sig Start Date End Date Taking? Authorizing Provider  sulfamethoxazole-trimethoprim (BACTRIM DS) 800-160 MG tablet Take 1 tablet by mouth 2 (two) times daily for 7 days. 06/07/22 06/14/22 Yes Elvenia Godden, Florene Route, PA-C  allopurinol (ZYLOPRIM) 300 MG tablet Take 300 mg by mouth daily. 11/10/15   [provider]  Alpha-Lipoic Acid 200 MG CAPS Take 200 mg by mouth daily in the afternoon.    [provider]  aspirin 81 MG chewable tablet Chew 1 tablet (81 mg total) by mouth daily. 07/03/19   Tommie Raymond, NP  B Complex CAPS See admin instructions.    [provider]  buPROPion (WELLBUTRIN XL) 300 MG 24 hr tablet Take 300 mg by mouth daily.     [provider]  carvedilol (COREG) 12.5 MG tablet Take 1 tablet (12.5 mg total) by mouth 2 (two) times daily with a meal. 02/11/22   Buford Dresser, MD  clopidogrel (PLAVIX) 75 MG tablet TAKE 1 TABLET(75 MG) BY MOUTH DAILY 04/03/22   Buford Dresser, MD  Continuous Blood Gluc Sensor (FREESTYLE LIBRE 14 DAY SENSOR) MISC SMARTSIG:1 Each Topical Every 2 Weeks 06/29/19   [provider]  DULoxetine (CYMBALTA) 60 MG capsule Take  120 mg by mouth daily. 11/10/15   [provider]  empagliflozin (JARDIANCE) 25 MG TABS tablet Take 25 mg by mouth daily.    [provider]  ENTRESTO 24-26 MG TAKE 1 TABLET BY MOUTH TWICE DAILY 03/21/22   Buford Dresser, MD  ezetimibe (ZETIA) 10 MG tablet Take 10 mg by mouth daily. 04/15/19   [provider]  Insulin Human (INSULIN PUMP) SOLN Inject into the skin continuous. novolog    [provider]  NOVOLOG 100 UNIT/ML injection SMARTSIG:100 Unit(s) SUB-Q Daily 06/29/19   [provider]  omeprazole (PRILOSEC) 20 MG capsule Take 20 mg by mouth daily.    [provider]  rosuvastatin (CRESTOR) 5 MG tablet TAKE 1 TABLET BY MOUTH 3-5 TIMES WEEKLY AS TOLERATED 05/25/21   Buford Dresser, MD  Zinc 50 MG CAPS Take 50 mg by mouth daily in the afternoon.    [provider]      Allergies    Losartan potassium    Review of Systems   Review of Systems See HPI Physical Exam Updated Vital Signs BP (!) 146/88 (BP Location: Right Wrist)   Pulse 99   Temp 98.6 F (37 C)   Resp 18   Ht 6' (1.829 m)   Wt 108.9 kg   SpO2 100%   BMI 32.55 kg/m  Physical Exam Constitutional:      General: He is not in acute distress. Cardiovascular:     Pulses: Normal pulses.  Comments: 2+ bilateral radial pulses with slightly increased rate Pulmonary:     Effort: Pulmonary effort is normal. No respiratory distress.  Musculoskeletal:        General: Swelling (Left hand) present.     Comments: Left hand: No dactylitis, fingers not held in flexion, no pain with passive extension, no pain on flexor tendons Full active range of motion in left hand  Skin:    General: Skin is warm and dry.     Capillary Refill: Capillary refill takes less than 2 seconds.     Comments: Left fourth digit: Superficial laceration at palmar PIP joint with no adipose tissue or bone exposure Left third digit: Superficial laceration at palmar MCP joint with no  adipose tissue or bone exposure  Neurological:     General: No focal deficit present.     Mental Status: He is alert and oriented to person, place, and time.     Comments: Sensation intact distally  Psychiatric:        Mood and Affect: Mood normal.     ED Results / Procedures / Treatments   Labs (all labs ordered are listed, but only abnormal results are displayed) Labs Reviewed - No data to display  EKG None  Radiology DG Hand Complete Left  Result Date: 06/07/2022 CLINICAL DATA:  Swelling EXAM: LEFT HAND - COMPLETE 3+ VIEW COMPARISON:  None Available. FINDINGS: No fracture or malalignment. No radiopaque foreign body in the soft tissues. Negative for soft tissue emphysema. IMPRESSION: No acute osseous abnormality Electronically Signed   By: Donavan Foil M.D.   On: 06/07/2022 20:56    Procedures Procedures    Medications Ordered in ED Medications  Tdap (BOOSTRIX) injection 0.5 mL (0.5 mLs Intramuscular Given 06/07/22 2050)  lidocaine (PF) (XYLOCAINE) 1 % injection 5 mL (5 mLs Infiltration Given 06/07/22 2122)    ED Course/ Medical Decision Making/ A&P                             Medical Decision Making Amount and/or Complexity of Data Reviewed Radiology: ordered.  Risk Prescription drug management.   Gabriel Bean 67 y.o. presented today for laceration. Working DDx that I considered at this time includes, but not limited to, infection, flexor tenosynovitis, neurovascular compromise, simple laceration.  R/o DDx: infection, flexor tenosynovitis, neurovascular compromise: These are considered less likely due to history of present illness and physical exam findings  Review of prior external notes: None  Unique Tests and My Interpretation:  Left hand x-ray: No foreign bodies or acute osseous changes noted  Discussion with Independent Historian: None  Discussion of Management of Tests: None  Risk:    Medium:  - prescription drug management  Risk  Stratification Score: none  Staffed with Tegeler, MD  Plan: Patient presented for left hand laceration. On exam patient was in no acute distress and stable vitals.  X-rays obtained as patient did have swelling in his left hand however there is no surrounding erythema and patient did not of pain in his hand.  X-ray was negative for any foreign bodies or soft tissue gases.  At this time since patient's laceration is not been present for more than 24 hours patient will be given Bactrim due to his diabetic state and follow up with a hand specialist to be further evaluated.  Patient not have his hand sutured today as that might increase risk for infection at this time.  Patient's hand was wrapped  and patient will be discharged.  Patient was given return precautions. Patient stable for discharge at this time.  Patient verbalized understanding of plan.         Final Clinical Impression(s) / ED Diagnoses Final diagnoses:  Laceration of left hand without foreign body, initial encounter    Rx / DC Orders ED Discharge Orders          Ordered    sulfamethoxazole-trimethoprim (BACTRIM DS) 800-160 MG tablet  2 times daily        06/07/22 2138              Elvina Sidle 06/07/22 2210    Tegeler, Gwenyth Allegra, MD 06/07/22 [phone]

## 2022-06-13 DIAGNOSIS — D509 Iron deficiency anemia, unspecified: Secondary | ICD-10-CM | POA: Diagnosis not present

## 2022-06-13 DIAGNOSIS — E7800 Pure hypercholesterolemia, unspecified: Secondary | ICD-10-CM | POA: Diagnosis not present

## 2022-06-13 DIAGNOSIS — Z79899 Other long term (current) drug therapy: Secondary | ICD-10-CM | POA: Diagnosis not present

## 2022-06-13 DIAGNOSIS — Z23 Encounter for immunization: Secondary | ICD-10-CM | POA: Diagnosis not present

## 2022-06-13 DIAGNOSIS — Z125 Encounter for screening for malignant neoplasm of prostate: Secondary | ICD-10-CM | POA: Diagnosis not present

## 2022-06-13 DIAGNOSIS — Z0000 Encounter for general adult medical examination without abnormal findings: Secondary | ICD-10-CM | POA: Diagnosis not present

## 2022-08-29 DIAGNOSIS — Z9641 Presence of insulin pump (external) (internal): Secondary | ICD-10-CM | POA: Diagnosis not present

## 2022-08-29 DIAGNOSIS — E1165 Type 2 diabetes mellitus with hyperglycemia: Secondary | ICD-10-CM | POA: Diagnosis not present

## 2022-08-29 DIAGNOSIS — I10 Essential (primary) hypertension: Secondary | ICD-10-CM | POA: Diagnosis not present

## 2022-08-29 DIAGNOSIS — E7800 Pure hypercholesterolemia, unspecified: Secondary | ICD-10-CM | POA: Diagnosis not present

## 2022-09-15 ENCOUNTER — Other Ambulatory Visit: Payer: Self-pay | Admitting: Cardiology

## 2022-09-16 NOTE — Telephone Encounter (Signed)
Rx(s) sent to pharmacy electronically.  

## 2022-11-07 ENCOUNTER — Ambulatory Visit (INDEPENDENT_AMBULATORY_CARE_PROVIDER_SITE_OTHER): Payer: Federal, State, Local not specified - PPO | Admitting: Family

## 2022-11-07 ENCOUNTER — Encounter (HOSPITAL_BASED_OUTPATIENT_CLINIC_OR_DEPARTMENT_OTHER): Payer: Self-pay | Admitting: Family

## 2022-11-07 VITALS — BP 112/70 | HR 79 | Ht 72.0 in | Wt 243.7 lb

## 2022-11-07 DIAGNOSIS — E118 Type 2 diabetes mellitus with unspecified complications: Secondary | ICD-10-CM

## 2022-11-07 DIAGNOSIS — I2510 Atherosclerotic heart disease of native coronary artery without angina pectoris: Secondary | ICD-10-CM | POA: Diagnosis not present

## 2022-11-07 DIAGNOSIS — E785 Hyperlipidemia, unspecified: Secondary | ICD-10-CM

## 2022-11-07 DIAGNOSIS — I255 Ischemic cardiomyopathy: Secondary | ICD-10-CM | POA: Diagnosis not present

## 2022-11-07 DIAGNOSIS — Z794 Long term (current) use of insulin: Secondary | ICD-10-CM

## 2022-11-07 DIAGNOSIS — G4733 Obstructive sleep apnea (adult) (pediatric): Secondary | ICD-10-CM

## 2022-11-07 MED ORDER — CLOPIDOGREL BISULFATE 75 MG PO TABS: 75.0000 mg | ORAL_TABLET | Freq: Every day | ORAL | 3 refills | Status: DC

## 2022-11-07 MED ORDER — CARVEDILOL 12.5 MG PO TABS: 12.5000 mg | ORAL_TABLET | Freq: Two times a day (BID) | ORAL | 3 refills | Status: DC

## 2022-11-07 MED ORDER — ENTRESTO 24-26 MG PO TABS: 1.0000 | ORAL_TABLET | Freq: Two times a day (BID) | ORAL | 3 refills | Status: DC

## 2022-11-07 NOTE — Progress Notes (Signed)
Cardiology Office Note:  .   Date:  11/16/2022  ID:  Gabriel Bean, DOB Jul 17, 1955, MRN [id number] PCP: Emilio Aspen, MD  [facility] Providers Cardiologist:  Jodelle Red, MD    History of Present Illness: .   Gabriel Bean is a 67 y.o. male history of CAD with NSTEMI 06/2019, ICM, DM 2 on insulin pump, HLD, hypertension, tobacco use, OSA on CPAP, syncope.  Seen 03/30/2021 noting significant improvement in his neuropathy symptoms after starting alpha lipoic acid.  Last seen 10/2021 doing well from a cardiac perspective.  He was pondering quitting tobacco.  Presents today for follow-up independently.  Feeling well since last seen. Reports no shortness of breath nor dyspnea on exertion. Reports no chest pain, pressure, or tightness. No edema, orthopnea, PND. Reports no palpitations.    ROS: Please see the history of present illness.    All other systems reviewed and are negative.   Studies Reviewed: Marland Kitchen   EKG Interpretation Date/Time:  Thursday November 07 2022 15:20:14 EDT Ventricular Rate:  79 PR Interval:  162 QRS Duration:  86 QT Interval:  368 QTC Calculation: 421 R Axis:   -15  Text Interpretation: Normal sinus rhythm Stable prior septal infarct. No acute ST/T wave changes. Confirmed by Gillian Shields ([id number]) on 11/07/2022 3:34:16 PM    Cardiac Studies & Procedures   CARDIAC CATHETERIZATION  CARDIAC CATHETERIZATION 07/01/2019  Narrative  Prox RCA to Mid RCA lesion is 60% stenosed.  Prox LAD to Mid LAD lesion is 100% stenosed.  Post intervention, there is a 0% residual stenosis.  A stent was successfully placed.  Total occlusion of the proximal LAD with initial TIMI 0 flow and faint septal collateralization via the distal RCA.  Normal left circumflex coronary artery.  Dominant RCA with 60% mid RCA stenosis..  Low normal to mildly impaired LV function with EF estimate at 50%; LVEDP 16 mmHg.  PCI to the LAD with ultimate insertion of a 3.0 x  26 mm Resolute Onyx DES stent postdilated to 3.25 mm with the 100% occlusion being reduced to 0% and TIMI 0 flow being improved to TIMI-3 flow.  RECOMMENDATION: DAPT for minimum of 1 year.  Smoking cessation is essential.  Optimal blood pressure control with target blood pressure less than 130/80 and ideal less than 120/80.  Aggressive lipid-lowering therapy with LDL goal less than 70.  Findings Coronary Findings Diagnostic  Dominance: Right  Left Anterior Descending Prox LAD to Mid LAD lesion is 100% stenosed.  First Diagonal Branch Vessel is small in size.  Right Coronary Artery Prox RCA to Mid RCA lesion is 60% stenosed.  Intervention  Prox LAD to Mid LAD lesion Stent A stent was successfully placed. Post-Intervention Lesion Assessment The intervention was successful. Pre-interventional TIMI flow is 0. Post-intervention TIMI flow is 3. No complications occurred at this lesion. There is a 0% residual stenosis post intervention.     ECHOCARDIOGRAM  ECHOCARDIOGRAM COMPLETE 10/05/2019  Narrative ECHOCARDIOGRAM REPORT    Patient Name:   Gabriel Bean Date of Exam: 10/05/2019 Medical Rec #:  [id number]       Height:       72.0 in Accession #:    [id number]      Weight:       261.8 lb Date of Birth:  [birth year]       BSA:          2.389 m Patient Age:    38 years  BP:           142/87 mmHg Patient Gender: M               HR:           71 bpm. Exam Location:  [address]  Procedure: 2D Echo, 3D Echo, Cardiac Doppler and Color Doppler  Indications:    I50.42 CHF  History:        Patient has prior history of Echocardiogram examinations, most recent 07/01/2019. Ischemic cardiomyopathy; Risk Factors:Hypertension, Diabetes and Current Smoker.  Sonographer:    Clearence Ped RCS Referring Phys: [id number] Beatriz Stallion  IMPRESSIONS   1. Left ventricular ejection fraction, by estimation, is 50 to 55%. The left ventricle has low normal function. The left ventricle  demonstrates regional wall motion abnormalities (see scoring diagram/findings for description). Mid to apical anteroseptal hypokinesis. Apex not well visualized but appears hypokinetic. Left ventricular diastolic parameters are indeterminate. 2. Right ventricular systolic function is normal. The right ventricular size is normal. There is normal pulmonary artery systolic pressure. The estimated right ventricular systolic pressure is 23.6 mmHg. 3. The mitral valve is normal in structure. Mild mitral valve regurgitation. 4. The aortic valve is tricuspid. Aortic valve regurgitation is not visualized. Mild aortic valve sclerosis is present, with no evidence of aortic valve stenosis. 5. The inferior vena cava is normal in size with greater than 50% respiratory variability, suggesting right atrial pressure of 3 mmHg.  FINDINGS Left Ventricle: Left ventricular ejection fraction, by estimation, is 50 to 55%. The left ventricle has low normal function. The left ventricle demonstrates regional wall motion abnormalities. The left ventricular internal cavity size was normal in size. There is no left ventricular hypertrophy. Left ventricular diastolic parameters are indeterminate.   LV Wall Scoring: The mid and distal anterior septum and apex are hypokinetic. The entire anterior wall, entire lateral wall, entire inferior wall, basal anteroseptal segment, mid inferoseptal segment, and basal inferoseptal segment are normal.  Right Ventricle: The right ventricular size is normal. Right vetricular wall thickness was not assessed. Right ventricular systolic function is normal. There is normal pulmonary artery systolic pressure. The tricuspid regurgitant velocity is 2.27 m/s, and with an assumed right atrial pressure of 3 mmHg, the estimated right ventricular systolic pressure is 23.6 mmHg.  Left Atrium: Left atrial size was normal in size.  Right Atrium: Right atrial size was normal in size.  Pericardium:  Trivial pericardial effusion is present.  Mitral Valve: The mitral valve is normal in structure. Mild mitral valve regurgitation.  Tricuspid Valve: The tricuspid valve is normal in structure. Tricuspid valve regurgitation is trivial.  Aortic Valve: The aortic valve is tricuspid. Aortic valve regurgitation is not visualized. Mild aortic valve sclerosis is present, with no evidence of aortic valve stenosis.  Pulmonic Valve: The pulmonic valve was not well visualized. Pulmonic valve regurgitation is not visualized.  Aorta: The aortic root and ascending aorta are structurally normal, with no evidence of dilitation.  Venous: The inferior vena cava is normal in size with greater than 50% respiratory variability, suggesting right atrial pressure of 3 mmHg.  IAS/Shunts: The interatrial septum was not well visualized.   LEFT VENTRICLE PLAX 2D LVIDd:         5.00 cm  Diastology LVIDs:         3.50 cm  LV e' lateral:   6.74 cm/s LV PW:         0.90 cm  LV E/e' lateral: 14.7 LV IVS:  0.90 cm  LV e' medial:    4.90 cm/s LVOT diam:     2.10 cm  LV E/e' medial:  20.3 LV SV:         62 LV SV Index:   26 LVOT Area:     3.46 cm  3D Volume EF: 3D EF:        57 % LV EDV:       137 ml LV ESV:       59 ml LV SV:        78 ml  RIGHT VENTRICLE RV Basal diam:  3.60 cm RV S prime:     9.90 cm/s TAPSE (M-mode): 1.6 cm RVSP:           23.6 mmHg  LEFT ATRIUM             Index       RIGHT ATRIUM           Index LA diam:        3.90 cm 1.63 cm/m  RA Pressure: 3.00 mmHg LA Vol (A2C):   59.2 ml 24.78 ml/m RA Area:     12.50 cm LA Vol (A4C):   43.7 ml 18.29 ml/m RA Volume:   31.60 ml  13.23 ml/m LA Biplane Vol: 50.4 ml 21.10 ml/m AORTIC VALVE LVOT Vmax:   73.80 cm/s LVOT Vmean:  54.600 cm/s LVOT VTI:    0.178 m  AORTA Ao Root diam: 3.20 cm Ao Asc diam:  3.40 cm  MITRAL VALVE                TRICUSPID VALVE MV Area (PHT):              TR Peak grad:   20.6 mmHg TR Vmax:        227.00  cm/s MR Peak grad: 100.8 mmHg    Estimated RAP:  3.00 mmHg MR Mean grad: 72.0 mmHg     RVSP:           23.6 mmHg MR Vmax:      502.00 cm/s MR Vmean:     400.0 cm/s    SHUNTS MV E velocity: 99.40 cm/s   Systemic VTI:  0.18 m MV A velocity: 126.00 cm/s  Systemic Diam: 2.10 cm MV E/A ratio:  0.79  Epifanio Lesches MD Electronically signed by Epifanio Lesches MD Signature Date/Time: 10/05/2019/7:54:56 PM    Final    MONITORS  CARDIAC EVENT MONITOR 08/08/2016  Narrative Sinus rhythm and sinus tachycardia Symptoms of lightheadedness and passing out associated with sinus rhythm rates 90-110 No arrhythmias seen  Will Camnitz, MD           Risk Assessment/Calculations:            Physical Exam:   VS:  BP 112/70 (BP Location: Left Arm, Patient Position: Sitting, Cuff Size: Large)   Pulse 79   Ht 6' (1.829 m)   Wt 243 lb 11.2 oz (110.5 kg)   BMI 33.05 kg/m    Wt Readings from Last 3 Encounters:  11/07/22 243 lb 11.2 oz (110.5 kg)  06/07/22 240 lb (108.9 kg)  11/05/21 246 lb (111.6 kg)    GEN: Well nourished, well developed in no acute distress NECK: No JVD; No carotid bruits CARDIAC: RRR, no murmurs, rubs, gallops RESPIRATORY:  Clear to auscultation without rales, wheezing or rhonchi  ABDOMEN: Soft, non-tender, non-distended EXTREMITIES:  No edema; No deformity   ASSESSMENT AND PLAN: .    CAD s/p  NSTEMI with PCI-LAD 06/2019 - Stable with no anginal symptoms. No indication for ischemic evaluation.  GDMT Aspirin, Plavix, Coreg, Jardiance, Zetia, Crestor. Recommend aiming for 150 minutes of moderate intensity activity per week and following a heart healthy diet.    HLD, LDL goal <70 - 05/2022 LDL 46. Continue Rosuvastatin 5mg  daily, Zetia 10mg  daily.  DM2 / Neuropathy - Follows with Dr. Talmage Nap  OSA - CPAP compliance encouraged.   ICM with recovered LVEF - Euvolemic and well compensated on exam. GDMT Carvedilol, Jardiance, Entresto. No indication for loop  diuretic.  Tobacco use - Smoking cessation encouraged. Recommend utilization of 1800QUITNOW.        Dispo: follow up in 1 year  Signed, Alver Sorrow, NP

## 2022-11-07 NOTE — Patient Instructions (Signed)
Medication Instructions:  Continue your current medications.   *If you need a refill on your cardiac medications before your next appointment, please call your pharmacy*  Lab Work: Your cholesterol numbers in March looked great!  Follow-Up: At [facility], you and your health needs are our priority.  As part of our continuing mission to provide you with exceptional heart care, we have created designated Provider Care Teams.  These Care Teams include your primary Cardiologist (physician) and Advanced Practice Providers (APPs -  Physician Assistants and Nurse Practitioners) who all work together to provide you with the care you need, when you need it.  We recommend signing up for the patient portal called "MyChart".  Sign up information is provided on this After Visit Summary.  MyChart is used to connect with patients for Virtual Visits (Telemedicine).  Patients are able to view lab/test results, encounter notes, upcoming appointments, etc.  Non-urgent messages can be sent to your provider as well.   To learn more about what you can do with MyChart, go to [email or web address].    Your next appointment:   1 year(s)  Provider:   Jodelle Red, MD    Other Instructions  Heart Healthy Diet Recommendations: A low-salt diet is recommended. Meats should be grilled, baked, or boiled. Avoid fried foods. Focus on lean protein sources like fish or chicken with vegetables and fruits. The American Heart Association is a Chief Technology Officer!  American Heart Association Diet and Lifeystyle Recommendations   Exercise recommendations: The American Heart Association recommends 150 minutes of moderate intensity exercise weekly. Try 30 minutes of moderate intensity exercise 4-5 times per week. This could include walking, jogging, or swimming.

## 2022-11-09 ENCOUNTER — Other Ambulatory Visit (HOSPITAL_BASED_OUTPATIENT_CLINIC_OR_DEPARTMENT_OTHER): Payer: Self-pay | Admitting: Cardiology

## 2022-11-09 DIAGNOSIS — I2510 Atherosclerotic heart disease of native coronary artery without angina pectoris: Secondary | ICD-10-CM

## 2022-11-09 DIAGNOSIS — I255 Ischemic cardiomyopathy: Secondary | ICD-10-CM

## 2022-12-12 ENCOUNTER — Other Ambulatory Visit: Payer: Self-pay | Admitting: Internal Medicine

## 2022-12-12 DIAGNOSIS — Z122 Encounter for screening for malignant neoplasm of respiratory organs: Secondary | ICD-10-CM

## 2022-12-19 ENCOUNTER — Other Ambulatory Visit: Payer: Self-pay | Admitting: Cardiology

## 2022-12-25 ENCOUNTER — Ambulatory Visit
Admission: RE | Admit: 2022-12-25 | Discharge: 2022-12-25 | Disposition: A | Discharge status: Home / Self Care | Payer: Federal, State, Local not specified - PPO | Source: Ambulatory Visit | Attending: Internal Medicine | Admitting: Internal Medicine

## 2022-12-25 DIAGNOSIS — Z122 Encounter for screening for malignant neoplasm of respiratory organs: Secondary | ICD-10-CM

## 2023-01-15 ENCOUNTER — Other Ambulatory Visit (HOSPITAL_COMMUNITY): Payer: Self-pay | Admitting: Internal Medicine

## 2023-01-15 DIAGNOSIS — E7800 Pure hypercholesterolemia, unspecified: Secondary | ICD-10-CM

## 2023-02-14 ENCOUNTER — Ambulatory Visit (HOSPITAL_BASED_OUTPATIENT_CLINIC_OR_DEPARTMENT_OTHER)
Admission: RE | Admit: 2023-02-14 | Discharge: 2023-02-14 | Disposition: A | Discharge status: Home / Self Care | Payer: Federal, State, Local not specified - PPO | Source: Ambulatory Visit | Attending: Internal Medicine | Admitting: Internal Medicine

## 2023-02-14 DIAGNOSIS — E7800 Pure hypercholesterolemia, unspecified: Secondary | ICD-10-CM | POA: Insufficient documentation

## 2023-03-16 ENCOUNTER — Other Ambulatory Visit: Payer: Self-pay | Admitting: Cardiology

## 2023-03-16 DIAGNOSIS — I255 Ischemic cardiomyopathy: Secondary | ICD-10-CM

## 2023-03-16 DIAGNOSIS — I2510 Atherosclerotic heart disease of native coronary artery without angina pectoris: Secondary | ICD-10-CM

## 2023-06-25 ENCOUNTER — Ambulatory Visit: Admitting: Podiatry

## 2023-06-25 DIAGNOSIS — Q666 Other congenital valgus deformities of feet: Secondary | ICD-10-CM | POA: Diagnosis not present

## 2023-06-25 NOTE — Progress Notes (Signed)
 Subjective:  Patient ID: Gabriel Bean, male    DOB: 1955-05-09,  MRN: [id number]  Chief Complaint  Patient presents with   Diabetes    Pt stated that he has neuropathy     68 y.o. male presents with the above complaint.  Patient presents with complaint of bilateral flatfoot deformity.  He states that has been present for quite some time.  He wanted to get it evaluated he has not seen anyone else prior to seeing me.  He would like to discuss treatment options for it.  He does not wear any kind of orthotics.  He works regular shoes.   Review of Systems: Negative except as noted in the HPI. Denies N/V/F/Ch.  Past Medical History:  Diagnosis Date   Chronic rhinitis    Coronary artery disease involving native coronary artery of native heart without angina pectoris 05/07/2020   GERD (gastroesophageal reflux disease)    HFrEF (heart failure with reduced ejection fraction) (HCC) 08/18/2019   History of skin cancer    HTN (hypertension) 07/02/2019   Ischemic cardiomyopathy 05/07/2020   Low libido    OCD (obsessive compulsive disorder)    OSA (obstructive sleep apnea)    Seasonal allergies     Current Outpatient Medications:    allopurinol (ZYLOPRIM) 300 MG tablet, Take 300 mg by mouth daily., Disp: , Rfl:    Alpha-Lipoic Acid 200 MG CAPS, Take 200 mg by mouth daily in the afternoon., Disp: , Rfl:    aspirin 81 MG chewable tablet, Chew 1 tablet (81 mg total) by mouth daily., Disp:  , Rfl:    B Complex CAPS, See admin instructions., Disp: , Rfl:    buPROPion (WELLBUTRIN XL) 300 MG 24 hr tablet, Take 300 mg by mouth daily. , Disp: , Rfl:    carvedilol (COREG) 12.5 MG tablet, Take 1 tablet (12.5 mg total) by mouth 2 (two) times daily with a meal., Disp: 180 tablet, Rfl: 3   cholecalciferol (VITAMIN D3) 25 MCG (1000 UNIT) tablet, Take 1,000 Units by mouth daily., Disp: , Rfl:    clopidogrel (PLAVIX) 75 MG tablet, Take 1 tablet (75 mg total) by mouth daily., Disp: 90 tablet, Rfl: 3    Continuous Blood Gluc Sensor (FREESTYLE LIBRE 14 DAY SENSOR) MISC, SMARTSIG:1 Each Topical Every 2 Weeks, Disp: , Rfl:    DULoxetine (CYMBALTA) 60 MG capsule, Take 120 mg by mouth daily., Disp: , Rfl:    empagliflozin (JARDIANCE) 25 MG TABS tablet, Take 25 mg by mouth daily., Disp: , Rfl:    ezetimibe (ZETIA) 10 MG tablet, Take 10 mg by mouth daily., Disp: , Rfl:    Insulin Human (INSULIN PUMP) SOLN, Inject into the skin continuous. novolog, Disp: , Rfl:    NOVOLOG 100 UNIT/ML injection, SMARTSIG:100 Unit(s) SUB-Q Daily, Disp: , Rfl:    omeprazole (PRILOSEC) 20 MG capsule, Take 20 mg by mouth daily., Disp: , Rfl:    rosuvastatin (CRESTOR) 5 MG tablet, TAKE 1 TABLET BY MOUTH 3 TO 5 TIMES WEEKLY AS TOLERATED, Disp: 90 tablet, Rfl: 3   sacubitril-valsartan (ENTRESTO) 24-26 MG, TAKE 1 TABLET BY MOUTH TWICE DAILY, Disp: 180 tablet, Rfl: 0   TURMERIC PO, Take 2,300 mg by mouth daily., Disp: , Rfl:    Zinc 50 MG CAPS, Take 50 mg by mouth daily in the afternoon., Disp: , Rfl:   Social History   Tobacco Use  Smoking Status Every Day   Current packs/day: 1.00   Types: Cigarettes  Smokeless Tobacco Former  Types: Snuff  Tobacco Comments   started smoking at age 23.      Allergies  Allergen Reactions   Losartan Potassium     Other reaction(s): Pottasium   Objective:  There were no vitals filed for this visit. There is no height or weight on file to calculate BMI. Constitutional Well developed. Well nourished.  Vascular Dorsalis pedis pulses palpable bilaterally. Posterior tibial pulses palpable bilaterally. Capillary refill normal to all digits.  No cyanosis or clubbing noted. Pedal hair growth normal.  Neurologic Normal speech. Oriented to person, place, and time. Epicritic sensation to light touch grossly present bilaterally.  Dermatologic Nails well groomed and normal in appearance. No open wounds. No skin lesions.  Orthopedic: Gait examination shows pes planovalgus with  calcaneal valgus to many toe signs partially but weak over the arch with dorsiflexion of the hallux unable to perform single limb at all heel raise   Radiographs: None Assessment:   1. Pes planovalgus    Plan:  Patient was evaluated and treated and all questions answered.  Pes planovalgus -I explained to patient the etiology of pes planovalgus and relationship with Planter fasciitis and various treatment options were discussed.  Given patient foot structure in the setting of Planter fasciitis I believe patient will benefit from custom-made orthotics to help control the hindfoot motion support the arch of the foot and take the stress away from plantar fascial.  Patient agrees with the plan like to proceed with orthotics -Patient was casted for orthotics   No follow-ups on file.

## 2023-07-17 ENCOUNTER — Emergency Department (HOSPITAL_BASED_OUTPATIENT_CLINIC_OR_DEPARTMENT_OTHER)
Admission: EM | Admit: 2023-07-17 | Discharge: 2023-07-17 | Disposition: A | Discharge status: Home / Self Care | Attending: Emergency Medicine | Admitting: Emergency Medicine

## 2023-07-17 ENCOUNTER — Other Ambulatory Visit: Payer: Self-pay

## 2023-07-17 ENCOUNTER — Encounter (HOSPITAL_BASED_OUTPATIENT_CLINIC_OR_DEPARTMENT_OTHER): Payer: Self-pay | Admitting: Emergency Medicine

## 2023-07-17 DIAGNOSIS — F17210 Nicotine dependence, cigarettes, uncomplicated: Secondary | ICD-10-CM | POA: Insufficient documentation

## 2023-07-17 DIAGNOSIS — Z794 Long term (current) use of insulin: Secondary | ICD-10-CM | POA: Insufficient documentation

## 2023-07-17 DIAGNOSIS — E119 Type 2 diabetes mellitus without complications: Secondary | ICD-10-CM | POA: Insufficient documentation

## 2023-07-17 DIAGNOSIS — I110 Hypertensive heart disease with heart failure: Secondary | ICD-10-CM | POA: Diagnosis not present

## 2023-07-17 DIAGNOSIS — L0291 Cutaneous abscess, unspecified: Secondary | ICD-10-CM

## 2023-07-17 DIAGNOSIS — Z79899 Other long term (current) drug therapy: Secondary | ICD-10-CM | POA: Diagnosis not present

## 2023-07-17 DIAGNOSIS — I509 Heart failure, unspecified: Secondary | ICD-10-CM | POA: Diagnosis not present

## 2023-07-17 DIAGNOSIS — I2510 Atherosclerotic heart disease of native coronary artery without angina pectoris: Secondary | ICD-10-CM | POA: Diagnosis not present

## 2023-07-17 DIAGNOSIS — L02212 Cutaneous abscess of back [any part, except buttock]: Secondary | ICD-10-CM | POA: Insufficient documentation

## 2023-07-17 DIAGNOSIS — Z7901 Long term (current) use of anticoagulants: Secondary | ICD-10-CM | POA: Diagnosis not present

## 2023-07-17 DIAGNOSIS — Z7982 Long term (current) use of aspirin: Secondary | ICD-10-CM | POA: Insufficient documentation

## 2023-07-17 MED ORDER — LIDOCAINE-EPINEPHRINE (PF) 2 %-1:200000 IJ SOLN
10.0000 mL | Freq: Once | INTRAMUSCULAR | Status: AC
  Administered 2023-07-17: 10 mL
  Filled 2023-07-17: qty 20

## 2023-07-17 MED ORDER — DOXYCYCLINE HYCLATE 100 MG PO CAPS: 100.0000 mg | ORAL_CAPSULE | Freq: Two times a day (BID) | ORAL | 0 refills | Status: DC

## 2023-07-17 NOTE — ED Provider Notes (Signed)
 [place] EMERGENCY DEPARTMENT AT [facility] Provider Note   CSN: [id number] Arrival date & time: 07/17/23  4098     History  Chief Complaint  Patient presents with   Abscess    Gabriel Bean is a 68 y.o. male.   Abscess   68 year old male presents to the emergency department complaints of abscess.  Abscess present on right upper back for the past week or so.  States he has been trying to squeeze the area without drainage.  Reports history of diabetes and concerned about infection.  Denies any fevers, chills, night sweats.  Has been on no recent antibiotics.  No history of recurrent abscesses.  No history of IV drug use.  Past medical history significant for hypertension, heart failure with reduced ejection fraction, ischemic cardiomyopathy, CAD, STEMI, diabetes mellitus type 2, hyperlipidemia  Home Medications Prior to Admission medications   Medication Sig Start Date End Date Taking? Authorizing Provider  doxycycline  (VIBRAMYCIN ) 100 MG capsule Take 1 capsule (100 mg total) by mouth 2 (two) times daily. 07/17/23  Yes Neil Balls A, PA  allopurinol  (ZYLOPRIM ) 300 MG tablet Take 300 mg by mouth daily. 11/10/15   [provider]  Alpha-Lipoic Acid 200 MG CAPS Take 200 mg by mouth daily in the afternoon.    [provider]  aspirin  81 MG chewable tablet Chew 1 tablet (81 mg total) by mouth daily. 07/03/19   McDaniel, Jill D, NP  B Complex CAPS See admin instructions.    [provider]  buPROPion  (WELLBUTRIN  XL) 300 MG 24 hr tablet Take 300 mg by mouth daily.     [provider]  carvedilol  (COREG ) 12.5 MG tablet Take 1 tablet (12.5 mg total) by mouth 2 (two) times daily with a meal. 11/07/22   Walker, Caitlin S, NP  cholecalciferol (VITAMIN D3) 25 MCG (1000 UNIT) tablet Take 1,000 Units by mouth daily.    [provider]  clopidogrel  (PLAVIX ) 75 MG tablet Take 1 tablet (75 mg total) by mouth daily. 11/07/22   Clearnce Curia,  NP  DULoxetine  (CYMBALTA ) 60 MG capsule Take 120 mg by mouth daily. 11/10/15   [provider]  empagliflozin (JARDIANCE) 25 MG TABS tablet Take 25 mg by mouth daily.    [provider]  ezetimibe  (ZETIA ) 10 MG tablet Take 10 mg by mouth daily. 04/15/19   [provider]  Insulin  Disposable Pump (OMNIPOD DASH PODS, GEN 4,) MISC Inject into the skin.    [provider]  Insulin  Human (INSULIN  PUMP) SOLN Inject into the skin continuous. novolog    [provider]  NOVOLOG 100 UNIT/ML injection SMARTSIG:100 Unit(s) SUB-Q Daily 06/29/19   [provider]  omeprazole (PRILOSEC) 20 MG capsule Take 20 mg by mouth daily.    [provider]  rosuvastatin  (CRESTOR ) 5 MG tablet TAKE 1 TABLET BY MOUTH 3 TO 5 TIMES WEEKLY AS TOLERATED 12/19/22   Sheryle Donning, MD  sacubitril-valsartan (ENTRESTO ) 24-26 MG TAKE 1 TABLET BY MOUTH TWICE DAILY 03/17/23   Sheryle Donning, MD  Zinc 50 MG CAPS Take 50 mg by mouth daily in the afternoon.    [provider]      Allergies    Losartan  potassium    Review of Systems   Review of Systems  All other systems reviewed and are negative.   Physical Exam Updated Vital Signs BP (!) 143/92   Pulse 78   Temp 99 F (37.2 C) (Oral)   Resp 16  SpO2 96%  Physical Exam Vitals and nursing note reviewed.  Constitutional:      General: He is not in acute distress.    Appearance: He is well-developed.  HENT:     Head: Normocephalic and atraumatic.  Eyes:     Conjunctiva/sclera: Conjunctivae normal.  Cardiovascular:     Rate and Rhythm: Normal rate and regular rhythm.     Heart sounds: No murmur heard. Pulmonary:     Effort: Pulmonary effort is normal. No respiratory distress.     Breath sounds: Normal breath sounds.  Abdominal:     Palpations: Abdomen is soft.     Tenderness: There is no abdominal tenderness.  Musculoskeletal:        General: No swelling.     Cervical back: Neck  supple.  Skin:    General: Skin is warm and dry.     Capillary Refill: Capillary refill takes less than 2 seconds.     Comments: Large 7.6 cm in diameter area of induration soft tissue overlying right scapula.  Area tender to the touch as well as erythematous, warm to palpation.  No expressible drainage.  Bedside ultrasound performed fluid collection.  Neurological:     Mental Status: He is alert.  Psychiatric:        Mood and Affect: Mood normal.    ED Results / Procedures / Treatments   Labs (all labs ordered are listed, but only abnormal results are displayed) Labs Reviewed - No data to display  EKG None  Radiology No results found.  Procedures .Incision and Drainage  Date/Time: 07/17/2023 11:23 AM  Performed by: Nance Butter, PA Authorized by: [place] Butter, PA   Consent:    Consent obtained:  Verbal   Consent given by:  Patient   Risks discussed:  Bleeding, incomplete drainage, pain and damage to other organs   Alternatives discussed:  No treatment Universal protocol:    Procedure explained and questions answered to patient or proxy's satisfaction: yes     Relevant documents present and verified: yes     Test results available : yes     Imaging studies available: yes     Required blood products, implants, devices, and special equipment available: yes     Site/side marked: yes     Immediately prior to procedure, a time out was called: yes     Patient identity confirmed:  Verbally with patient Location:    Type:  Abscess   Size:  7.6   Location:  Trunk   Trunk location:  Back Pre-procedure details:    Skin preparation:  Betadine Anesthesia:    Anesthesia method:  Local infiltration   Local anesthetic:  Lidocaine  2% WITH epi Procedure type:    Complexity:  Complex Procedure details:    Incision types:  Single straight   Incision depth:  Subcutaneous   Wound management:  Probed and deloculated, irrigated with saline and extensive cleaning   Drainage:   Purulent   Drainage amount:  Moderate   Packing materials:  1/4 in gauze   Amount 1/4":  3 Post-procedure details:    Procedure completion:  Tolerated well, no immediate complications    Medications Ordered in ED Medications  lidocaine -EPINEPHrine (XYLOCAINE  W/EPI) 2 %-1:200000 (PF) injection 10 mL (10 mLs Infiltration Given by Other 07/17/23 1011)    ED Course/ Medical Decision Making/ A&P  Medical Decision Making Risk Prescription drug management.   This patient presents to the ED for concern of abscess, this involves an extensive number of treatment options, and is a complaint that carries with it a high risk of complications and morbidity.  The differential diagnosis includes abscess, cellulitis, necrotizing infection, sepsis, other   Co morbidities that complicate the patient evaluation  See HPI   Additional history obtained:  Additional history obtained from EMR External records from outside source obtained and reviewed including hospital records   Lab Tests:  N/a   Imaging Studies ordered:  N/a   Cardiac Monitoring: / EKG:  N/a   Consultations Obtained:  N/a   Problem List / ED Course / Critical interventions / Medication management  Abscess I ordered medication including lidocaine  with epinephrine   Reevaluation of the patient after these medicines showed that the patient improved I have reviewed the patients home medicines and have made adjustments as needed   Social Determinants of Health:  Chronic cigarette use.  Denies illicit drug use.   Test / Admission - Considered:  Abscess Vitals signs significant for hypertension. Otherwise within normal range and stable throughout visit. 68 year old male presents to the emergency department complaints of abscess.  Abscess present on right upper back for the past week or so.  States he has been trying to squeeze the area without drainage.  Reports history of  diabetes and concerned about infection.  Denies any fevers, chills, night sweats.  Has been on no recent antibiotics.  No history of recurrent abscesses.  No history of IV drug use. On exam, large area of induration right upper back with bedside ultrasound confirming suspicion for fluid collection.  Area anesthetized and drained, and irrigated manner as above with significant improvement of symptoms.  Given surrounding erythema, will place patient empirically on antibiotics.  Will recommend local wound care at home as described in AVS.  Treatment plan discussed with patient and he acknowledged understanding was agreeable to said plan.  Patient overall well-appearing, afebrile in no acute distress. Worrisome signs and symptoms were discussed with the patient, and the patient acknowledged understanding to return to the ED if noticed. Patient was stable upon discharge.          Final Clinical Impression(s) / ED Diagnoses Final diagnoses:  Abscess    Rx / DC Orders ED Discharge Orders          Ordered    doxycycline  (VIBRAMYCIN ) 100 MG capsule  2 times daily        07/17/23 1057              Bieber Butter, [place] 07/17/23 1126    Lowery Rue, DO 07/17/23 1148

## 2023-07-17 NOTE — ED Notes (Signed)
Supplies at bedside.

## 2023-07-17 NOTE — ED Triage Notes (Signed)
 States he has boil on R shoulder/back. No hx.

## 2023-07-17 NOTE — Discharge Instructions (Addendum)
 As discussed, your abscess was drained while in the emergency department.  You may wash area gently with warm soapy water.  Remove packing at home in the next 48 to 72 hours if it does not fall out on its own.  It falls out on its own, please do not try to push it back in.  Will place you on antibiotics for treatment of infection.  You may take Tylenol /Motrin for pain.  Please do not hesitate to return if the worrisome signs and symptoms we discussed become apparent.

## 2023-07-17 NOTE — ED Notes (Signed)
 Dc instructions reviewed with patient. Patient voiced understanding. Dc with belongings.

## 2023-07-18 ENCOUNTER — Telehealth: Payer: Self-pay

## 2023-07-18 NOTE — Telephone Encounter (Signed)
 LVM to schedule orthotic pick up

## 2023-07-23 ENCOUNTER — Ambulatory Visit

## 2023-07-23 NOTE — Progress Notes (Signed)
 Patient presents today to pick up custom molded foot orthotics, diagnosed with Pes Planus  by Dr. Lydia Sams.   Orthotics were dispensed and fit was satisfactory. Reviewed instructions for break-in and wear. Written instructions given to patient.  Patient will follow up as needed.   Britton Cane Cped, CFo, CFm

## 2023-11-28 ENCOUNTER — Other Ambulatory Visit (HOSPITAL_BASED_OUTPATIENT_CLINIC_OR_DEPARTMENT_OTHER): Payer: Self-pay | Admitting: Family

## 2023-11-28 DIAGNOSIS — I255 Ischemic cardiomyopathy: Secondary | ICD-10-CM

## 2023-11-28 DIAGNOSIS — I2510 Atherosclerotic heart disease of native coronary artery without angina pectoris: Secondary | ICD-10-CM

## 2023-12-18 ENCOUNTER — Other Ambulatory Visit (HOSPITAL_BASED_OUTPATIENT_CLINIC_OR_DEPARTMENT_OTHER): Payer: Self-pay | Admitting: Family

## 2023-12-18 DIAGNOSIS — I255 Ischemic cardiomyopathy: Secondary | ICD-10-CM

## 2023-12-18 DIAGNOSIS — I2510 Atherosclerotic heart disease of native coronary artery without angina pectoris: Secondary | ICD-10-CM

## 2023-12-19 ENCOUNTER — Encounter (HOSPITAL_BASED_OUTPATIENT_CLINIC_OR_DEPARTMENT_OTHER): Payer: Self-pay | Admitting: Cardiology

## 2023-12-19 ENCOUNTER — Ambulatory Visit (HOSPITAL_BASED_OUTPATIENT_CLINIC_OR_DEPARTMENT_OTHER): Admitting: Cardiology

## 2023-12-19 VITALS — BP 118/78 | HR 92 | Ht 72.0 in | Wt 261.7 lb

## 2023-12-19 DIAGNOSIS — Z794 Long term (current) use of insulin: Secondary | ICD-10-CM

## 2023-12-19 DIAGNOSIS — I255 Ischemic cardiomyopathy: Secondary | ICD-10-CM

## 2023-12-19 DIAGNOSIS — F17210 Nicotine dependence, cigarettes, uncomplicated: Secondary | ICD-10-CM

## 2023-12-19 DIAGNOSIS — E782 Mixed hyperlipidemia: Secondary | ICD-10-CM

## 2023-12-19 DIAGNOSIS — E118 Type 2 diabetes mellitus with unspecified complications: Secondary | ICD-10-CM

## 2023-12-19 DIAGNOSIS — Z716 Tobacco abuse counseling: Secondary | ICD-10-CM

## 2023-12-19 DIAGNOSIS — F17200 Nicotine dependence, unspecified, uncomplicated: Secondary | ICD-10-CM

## 2023-12-19 DIAGNOSIS — I252 Old myocardial infarction: Secondary | ICD-10-CM

## 2023-12-19 DIAGNOSIS — I2510 Atherosclerotic heart disease of native coronary artery without angina pectoris: Secondary | ICD-10-CM | POA: Diagnosis not present

## 2023-12-19 DIAGNOSIS — Z955 Presence of coronary angioplasty implant and graft: Secondary | ICD-10-CM

## 2023-12-19 NOTE — Progress Notes (Signed)
 Cardiology Office Note:  .   Date:  12/19/2023  ID:  Gabriel Bean, DOB [birth year], MRN [id number] PCP: Charlott Dorn LABOR, MD  [facility] Providers Cardiologist:  Shelda Bruckner, MD {  History of Present Illness: .   Gabriel Bean is a 68 y.o. male with a hx of CAD with NSTEMI 06/2019, ischemic cardiomyopathy, type II diabetes on insulin  pump, hyperlipidemia, hypertension, tobacco use, OSA on CPAP who is seen for follow up today. I met him during his hospitalization for NSTEMI 06/2019.   Today: He is retired. Hangs around the house most of the day, goes to the bar from about 4-8 PM, comes home, goes to bed around 10 pm.   Still smoking about 1 ppd, has mild shortness of breath chronically on exertion. No chest heaviness/tightness, but sometimes feels like it isn't beating right, but goes away in seconds. Nonexertional. No clear triggers.   Reviewed his lipid results, not at goal. Taking rosuvastatin  3x/week. Discussed options, see below.  ROS: Denies chest pain, shortness of breath at rest or with normal exertion. No PND, orthopnea, LE edema or unexpected weight gain. No syncope or palpitations. ROS otherwise negative except as noted.   Studies Reviewed: SABRA    EKG:  EKG Interpretation Date/Time:  Friday December 19 2023 15:23:31 EDT Ventricular Rate:  92 PR Interval:  162 QRS Duration:  80 QT Interval:  344 QTC Calculation: 425 R Axis:   86  Text Interpretation: Normal sinus rhythm Anteroseptal infarct (cited on or before 19-Dec-2023) T wave abnormality, consider inferior ischemia Confirmed by Bruckner Shelda [id number]) on 12/19/2023 6:21:27 PM    Physical Exam:   VS:  BP 118/78   Pulse 92   Ht 6' (1.829 m)   Wt 261 lb 11.2 oz (118.7 kg)   SpO2 94%   BMI 35.49 kg/m    Wt Readings from Last 3 Encounters:  12/19/23 261 lb 11.2 oz (118.7 kg)  11/07/22 243 lb 11.2 oz (110.5 kg)  06/07/22 240 lb (108.9 kg)    GEN: Well nourished, well developed in no  acute distress HEENT: Normal, moist mucous membranes NECK: No JVD CARDIAC: regular rhythm, normal S1 and S2, no rubs or gallops. No murmur. VASCULAR: Radial and DP pulses 2+ bilaterally. No carotid bruits RESPIRATORY:  Clear to auscultation without rales, wheezing or rhonchi  ABDOMEN: Soft, non-tender, non-distended MUSCULOSKELETAL:  Ambulates independently SKIN: Warm and dry, no edema NEUROLOGIC:  Alert and oriented x 3. No focal neuro deficits noted. PSYCHIATRIC:  Normal affect    ASSESSMENT AND PLAN: .    NSTEMI (borderline STEMI), s/p PCI to LAD 06/2019:  -dual antiplatelet currently with aspirin  and clopidogrel . We have discussed dropping to single agent, discussed that recent data suggests clopidogrel  may be a better long term single agent than aspirin . For now he feels he is doing well and does not wish to make changes. -continue carvedilol  -no angina -prior ABIs without PAD -reviewed red flag warning signs that need immediate medical attention   Hyperlipidemia, mixed  -on rosuvastatin  5 mg three times/week -last LDL 63 and TG 207 from 06/2023 from [facility] -discussed that with diabetes, LDL goal <55 and TG goal <150.  -we discussed lifestyle at length, specially diet, exercise, alcohol. He uses butter and lard when he cooks, eats a lot of cheese, does deep fry food, eats pork/sausage and other smoked meats, eats Spam.  -discussed lp(a), ordered today   Type II diabetes, with significant long term neuropathy Obesity, BMI 35 -on  insulin  pump and Jardiance   Ischemic cardiomyopathy with recovered EF -on carvedilol , entresto , Jardiance -EF improved on echo to 50-55% post MI -not very active at baseline, but denies symptoms, NYHA class 1   Tobacco cessation: The patient was counseled on tobacco cessation today for 4 minutes.  Counseling included reviewing the risks of smoking tobacco products, how it impacts the patient's current medical diagnoses and different strategies for  quitting.  Pharmacotherapy to aid in tobacco cessation was not prescribed today.   CV risk counseling and prevention -recommend heart healthy/Mediterranean diet, with whole grains, fruits, vegetable, fish, lean meats, nuts, and olive oil. Limit salt. -recommend moderate walking, 3-5 times/week for 30-50 minutes each session. Aim for at least 150 minutes/week. Goal should be pace of 3 miles/hours, or walking 1.5 miles in 30 minutes -recommend avoidance of tobacco products. Avoid excess alcohol.  Dispo: 1 year or sooner as needed (labs in 6 mos)  Signed, Shelda Bruckner, MD   Shelda Bruckner, MD, PhD, [facility] [place]  [facility]  [place]  Heart & Vascular at [facility] at [facility] [address] [place], [place] [address] [phone]

## 2023-12-19 NOTE — Patient Instructions (Signed)
 Try to substitute some of the high saturated fat foods with more healthy fats/unsaturated fats. This can greatly improve your triglycerides and LDL (bad cholesterol). Increasing activity and cutting back alcohol can also help these numbers. I would give it six months to try really good lifestyle change and then recheck fasting cholesterol to see if we are at goal.  Medication Instructions:  No changes today *If you need a refill on your cardiac medications before your next appointment, please call your pharmacy*  Lab Work: In 6 months - return for blood work If you have labs (blood work) drawn today and your tests are completely normal, you will receive your results only by: [employer] (if you have MyChart) OR A paper copy in the mail If you have any lab test that is abnormal or we need to change your treatment, we will call you to review the results.  Testing/Procedures: none  Follow-Up: At [facility], you and your health needs are our priority.  As part of our continuing mission to provide you with exceptional heart care, our providers are all part of one team.  This team includes your primary Cardiologist (physician) and Advanced Practice Providers or APPs (Physician Assistants and Nurse Practitioners) who all work together to provide you with the care you need, when you need it.  Your next appointment:   12 month(s)  Provider:   Shelda Bruckner, MD, Rosaline Bane, NP, or Reche Finder, NP

## 2023-12-29 ENCOUNTER — Other Ambulatory Visit (HOSPITAL_COMMUNITY): Payer: Self-pay | Admitting: Internal Medicine

## 2023-12-29 DIAGNOSIS — F17200 Nicotine dependence, unspecified, uncomplicated: Secondary | ICD-10-CM

## 2023-12-29 DIAGNOSIS — Z122 Encounter for screening for malignant neoplasm of respiratory organs: Secondary | ICD-10-CM

## 2023-12-30 ENCOUNTER — Other Ambulatory Visit (HOSPITAL_BASED_OUTPATIENT_CLINIC_OR_DEPARTMENT_OTHER): Payer: Self-pay | Admitting: Cardiology

## 2023-12-30 DIAGNOSIS — I2510 Atherosclerotic heart disease of native coronary artery without angina pectoris: Secondary | ICD-10-CM

## 2023-12-30 DIAGNOSIS — I255 Ischemic cardiomyopathy: Secondary | ICD-10-CM

## 2024-01-06 ENCOUNTER — Ambulatory Visit (HOSPITAL_COMMUNITY)
Admission: RE | Admit: 2024-01-06 | Discharge: 2024-01-06 | Disposition: A | Discharge status: Home / Self Care | Source: Ambulatory Visit | Attending: Internal Medicine | Admitting: Internal Medicine

## 2024-01-06 DIAGNOSIS — F17200 Nicotine dependence, unspecified, uncomplicated: Secondary | ICD-10-CM | POA: Diagnosis present

## 2024-01-06 DIAGNOSIS — Z122 Encounter for screening for malignant neoplasm of respiratory organs: Secondary | ICD-10-CM | POA: Insufficient documentation

## 2024-01-21 ENCOUNTER — Encounter (HOSPITAL_BASED_OUTPATIENT_CLINIC_OR_DEPARTMENT_OTHER): Payer: Self-pay

## 2024-01-21 DIAGNOSIS — I255 Ischemic cardiomyopathy: Secondary | ICD-10-CM

## 2024-01-21 DIAGNOSIS — I2510 Atherosclerotic heart disease of native coronary artery without angina pectoris: Secondary | ICD-10-CM

## 2024-01-21 MED ORDER — SACUBITRIL-VALSARTAN 24-26 MG PO TABS: 1.0000 | ORAL_TABLET | Freq: Two times a day (BID) | ORAL | 3 refills | Status: AC

## 2024-01-29 ENCOUNTER — Other Ambulatory Visit: Payer: Self-pay | Admitting: Cardiology

## 2024-02-02 ENCOUNTER — Other Ambulatory Visit (HOSPITAL_BASED_OUTPATIENT_CLINIC_OR_DEPARTMENT_OTHER): Payer: Self-pay | Admitting: Family

## 2024-02-02 DIAGNOSIS — I2510 Atherosclerotic heart disease of native coronary artery without angina pectoris: Secondary | ICD-10-CM

## 2024-03-03 ENCOUNTER — Other Ambulatory Visit (HOSPITAL_BASED_OUTPATIENT_CLINIC_OR_DEPARTMENT_OTHER): Payer: Self-pay | Admitting: Family

## 2024-03-03 DIAGNOSIS — I2510 Atherosclerotic heart disease of native coronary artery without angina pectoris: Secondary | ICD-10-CM

## 2024-03-03 DIAGNOSIS — I255 Ischemic cardiomyopathy: Secondary | ICD-10-CM
